# Patient Record
Sex: Male | Born: 1975 | Race: Black or African American | Hispanic: No | Marital: Single | Smoking: Current every day smoker
Health system: Southern US, Community
[De-identification: ages and names within clinical notes are randomized; demographics above are authoritative.]

## PROBLEM LIST (undated history)

## (undated) DIAGNOSIS — K219 Gastro-esophageal reflux disease without esophagitis: Secondary | ICD-10-CM

## (undated) HISTORY — PX: COLONOSCOPY: SHX174

---

## 2007-10-01 ENCOUNTER — Emergency Department (HOSPITAL_COMMUNITY): Admission: EM | Admit: 2007-10-01 | Discharge: 2007-10-01 | Payer: Self-pay | Admitting: Emergency Medicine

## 2007-10-24 ENCOUNTER — Emergency Department: Payer: Self-pay | Admitting: Emergency Medicine

## 2012-05-18 ENCOUNTER — Emergency Department (HOSPITAL_COMMUNITY): Payer: Self-pay

## 2012-05-18 ENCOUNTER — Emergency Department (HOSPITAL_COMMUNITY)
Admission: EM | Admit: 2012-05-18 | Discharge: 2012-05-18 | Disposition: A | Discharge status: Home / Self Care | Payer: Self-pay | Attending: Emergency Medicine | Admitting: Emergency Medicine

## 2012-05-18 ENCOUNTER — Encounter (HOSPITAL_COMMUNITY): Payer: Self-pay | Admitting: *Deleted

## 2012-05-18 DIAGNOSIS — R071 Chest pain on breathing: Secondary | ICD-10-CM | POA: Insufficient documentation

## 2012-05-18 DIAGNOSIS — M25519 Pain in unspecified shoulder: Secondary | ICD-10-CM | POA: Insufficient documentation

## 2012-05-18 DIAGNOSIS — R059 Cough, unspecified: Secondary | ICD-10-CM | POA: Insufficient documentation

## 2012-05-18 LAB — CBC
HCT: 42.9 % (ref 39.0–52.0)
Hemoglobin: 16.4 g/dL (ref 13.0–17.0)
MCH: 31.8 pg (ref 26.0–34.0)
MCHC: 38.2 g/dL — ABNORMAL HIGH (ref 30.0–36.0)
MCV: 83.3 fL (ref 78.0–100.0)
Platelets: 214 K/uL (ref 150–400)
RBC: 5.15 MIL/uL (ref 4.22–5.81)
RDW: 13.8 % (ref 11.5–15.5)
WBC: 9.7 K/uL (ref 4.0–10.5)

## 2012-05-18 LAB — BASIC METABOLIC PANEL WITH GFR
Chloride: 103 meq/L (ref 96–112)
GFR calc Af Amer: >90 mL/min (ref >90)
GFR calc non Af Amer: >90 mL/min (ref >90)
Potassium: 4.7 meq/L (ref 3.5–5.1)
Sodium: 137 meq/L (ref 135–145)

## 2012-05-18 LAB — BASIC METABOLIC PANEL
BUN: 12 mg/dL (ref 6–23)
CO2: 23 mEq/L (ref 19–32)
Calcium: 9.7 mg/dL (ref 8.4–10.5)
Creatinine, Ser: 0.99 mg/dL (ref 0.50–1.35)
Glucose, Bld: 110 mg/dL — ABNORMAL HIGH (ref 70–99)

## 2012-05-18 MED ORDER — CYCLOBENZAPRINE HCL 10 MG PO TABS: 10.0000 mg | ORAL_TABLET | Freq: Every day | ORAL | Status: DC

## 2012-05-18 MED ORDER — IBUPROFEN 800 MG PO TABS: 800.0000 mg | ORAL_TABLET | Freq: Three times a day (TID) | ORAL | Status: DC

## 2012-05-18 NOTE — ED Provider Notes (Signed)
History     CSN: [id number]  Arrival date & time 05/18/12  0534   First MD Initiated Contact with Patient 05/18/12 [phone]      Chief Complaint  Patient presents with  . Chest Pain    (Consider location/radiation/quality/duration/timing/severity/associated sxs/prior treatment) Patient is a 37 y.o. male presenting with chest pain. The history is provided by the patient. No language interpreter was used.  Chest Pain Pain location:  L chest Pain quality: sharp   Pain radiates to:  L shoulder Pain radiates to the back: yes   Pain severity:  Moderate Onset quality:  Sudden Duration: Pain is sharp and fleeting, lasting seconds.  Context comment:  Pain is sudden in onset with cough, usually occurs at night. Associated symptoms: no abdominal pain, no fever, no numbness, no palpitations, no shortness of breath, not vomiting and no weakness   Associated symptoms comment:  He has had some mild left shoulder soreness x 3 weeks. For the past 2 nights, he has experienced a sharp, shooting pain when he coughs that radiates into left neck and into the shoulder blade. It last seconds before it resolves. He denies shortness of breath, or pain with respiration. He says that his cough is a chronic cough that is unchanged. No fever.   History reviewed. No pertinent past medical history.  History reviewed. No pertinent past surgical history.  History reviewed. No pertinent family history.  History  Substance Use Topics  . Smoking status: Never Smoker   . Smokeless tobacco: Not on file  . Alcohol Use: No      Review of Systems  Constitutional: Negative for fever.  Respiratory: Negative.  Negative for shortness of breath.   Cardiovascular: Positive for chest pain. Negative for palpitations and leg swelling.  Gastrointestinal: Negative for vomiting and abdominal pain.  Musculoskeletal:       See HPI.  Skin: Negative.  Negative for pallor and rash.  Neurological: Negative.  Negative for  weakness and numbness.  Hematological: Does not bruise/bleed easily.  Psychiatric/Behavioral: Negative.  Negative for confusion.    Allergies  Review of patient's allergies indicates no known allergies.  Home Medications   Current Outpatient Rx  Name  Route  Sig  Dispense  Refill  . ibuprofen (ADVIL,MOTRIN) 200 MG tablet   Oral   Take 200 mg by mouth every 8 (eight) hours as needed for pain.           BP 132/88  Pulse 95  Temp(Src) 99.4 F (37.4 C) (Oral)  Resp 16  SpO2 94%  Physical Exam  Constitutional: He is oriented to person, place, and time. He appears well-developed and well-nourished.  HENT:  Head: Normocephalic.  Neck: Normal range of motion. Neck supple.  Cardiovascular: Normal rate and regular rhythm.   No murmur heard. Pulmonary/Chest: Effort normal and breath sounds normal. He has no wheezes. He has no rales. He exhibits tenderness.  Left sided chest tenderness to palpation. No swelling or discoloration.   Abdominal: Soft. Bowel sounds are normal. There is no tenderness. There is no rebound and no guarding.  Musculoskeletal: Normal range of motion.  Mild tenderness to left lateral neck to deltoid area. No swelling. No loss of strength. FROM. No upper back, scapular or parascapular tenderness.   Neurological: He is alert and oriented to person, place, and time.  Skin: Skin is warm and dry. No rash noted.  Psychiatric: He has a normal mood and affect.    ED Course  Procedures (including critical care time)  Labs Reviewed  BASIC METABOLIC PANEL - Abnormal; Notable for the following:    Glucose, Bld 110 (*)    All other components within normal limits  CBC  POCT I-STAT TROPONIN I   Results for orders placed during the hospital encounter of 05/18/12  BASIC METABOLIC PANEL      Result Value Range   Sodium 137  135 - 145 mEq/L   Potassium 4.7  3.5 - 5.1 mEq/L   Chloride 103  96 - 112 mEq/L   CO2 23  19 - 32 mEq/L   Glucose, Bld 110 (*) 70 - 99 mg/dL    BUN 12  6 - 23 mg/dL   Creatinine, Ser 1.61  0.50 - 1.35 mg/dL   Calcium 9.7  8.4 - 09.6 mg/dL   GFR calc non Af Amer >90  >90 mL/min   GFR calc Af Amer >90  >90 mL/min  POCT I-STAT TROPONIN I      Result Value Range   Troponin i, poc 0.00  0.00 - 0.08 ng/mL   Comment 3             Dg Chest 2 View  05/18/2012  *RADIOLOGY REPORT*  Clinical Data: Chest pain.  CHEST - 2 VIEW  Comparison: None  Findings: Linear densities in the right middle lobe and lingula. This likely represents atelectasis.  Recommend clinical correlation to completely exclude lingular infiltrate/early pneumonia.  No effusions.  Heart is normal size.  No acute bony abnormality.  IMPRESSION: Linear densities in the lingula and right middle lobe, greater within the lingula.  Findings likely reflect atelectasis. Recommend clinical correlation to completely exclude pneumonia.   Original Report Authenticated By: Charlett Nose, M.D.     Date: 05/18/2012  Rate: 93  Rhythm: normal sinus rhythm  QRS Axis: normal  Intervals: normal  ST/T Wave abnormalities: normal  Conduction Disutrbances:none  Narrative Interpretation:   Old EKG Reviewed: none available    No diagnosis found.   1. Chest wall pain  MDM  Symptoms are discomfort in left chest that is worse with movement, cough - suspect muscular pain, possible spasm. No SOB, neg EKG, neg CXR, neg labs with atypical symptoms - doubt ACS, pulmonary cause.        Arnoldo Hooker, PA-C 05/18/12 0730

## 2012-05-18 NOTE — ED Provider Notes (Signed)
Medical screening examination/treatment/procedure(s) were performed by non-physician practitioner and as supervising physician I was immediately available for consultation/collaboration.   Colin Mason. Oletta Lamas, MD 05/18/12 [phone]

## 2012-05-18 NOTE — ED Notes (Signed)
Pt c/o CP since last night, c/o left CP with intermittent radiation to the left shoulder.  Denies SOB, n/v, diaphoresis

## 2012-05-28 ENCOUNTER — Emergency Department (HOSPITAL_COMMUNITY)
Admission: EM | Admit: 2012-05-28 | Discharge: 2012-05-28 | Disposition: A | Discharge status: Home / Self Care | Payer: Self-pay | Attending: Emergency Medicine | Admitting: Emergency Medicine

## 2012-05-28 ENCOUNTER — Emergency Department (HOSPITAL_COMMUNITY): Payer: Self-pay

## 2012-05-28 ENCOUNTER — Encounter (HOSPITAL_COMMUNITY): Payer: Self-pay | Admitting: *Deleted

## 2012-05-28 DIAGNOSIS — R51 Headache: Secondary | ICD-10-CM | POA: Insufficient documentation

## 2012-05-28 DIAGNOSIS — M542 Cervicalgia: Secondary | ICD-10-CM | POA: Insufficient documentation

## 2012-05-28 DIAGNOSIS — F17200 Nicotine dependence, unspecified, uncomplicated: Secondary | ICD-10-CM | POA: Insufficient documentation

## 2012-05-28 MED ORDER — IBUPROFEN 800 MG PO TABS
800.0000 mg | ORAL_TABLET | Freq: Once | ORAL | Status: AC
  Administered 2012-05-28: 800 mg via ORAL
  Filled 2012-05-28: qty 1

## 2012-05-28 MED ORDER — PREDNISONE 20 MG PO TABS: 60.0000 mg | ORAL_TABLET | Freq: Every day | ORAL | Status: DC

## 2012-05-28 MED ORDER — OXYCODONE-ACETAMINOPHEN 5-325 MG PO TABS: 1.0000 | ORAL_TABLET | Freq: Four times a day (QID) | ORAL | Status: DC | PRN

## 2012-05-28 NOTE — ED Provider Notes (Signed)
History     CSN: [id number]  Arrival date & time 05/28/12  1146   First MD Initiated Contact with Patient 05/28/12 1154      Chief Complaint  Patient presents with  . Headache    (Consider location/radiation/quality/duration/timing/severity/associated sxs/prior treatment) Patient is a 37 y.o. male presenting with headaches. The history is provided by the patient.  Headache Associated symptoms: neck pain   Associated symptoms: no abdominal pain, no back pain, no diarrhea, no pain, no nausea, no neck stiffness, no numbness and no vomiting    patient was seen about a month ago for left sided chest pain and shoulder pain. His diagnosis was chest wall pain. Since then he has had head pain it goes up his neck to his head. It is worse with certain movements. Is worse with exertion. Is better with sitting although he still has a dull pain there. No difficulty hearing. No difficulty seen. No nausea or vomiting. No confusion. No numbness or weakness. He has some relief with ibuprofen. History reviewed. No pertinent past medical history.  History reviewed. No pertinent past surgical history.  History reviewed. No pertinent family history.  History  Substance Use Topics  . Smoking status: Current Every Day Smoker -- 0.50 packs/day  . Smokeless tobacco: Not on file  . Alcohol Use: No      Review of Systems  Constitutional: Negative for activity change and appetite change.  HENT: Positive for neck pain. Negative for neck stiffness.   Eyes: Negative for pain.  Respiratory: Negative for chest tightness and shortness of breath.   Cardiovascular: Negative for chest pain and leg swelling.  Gastrointestinal: Negative for nausea, vomiting, abdominal pain and diarrhea.  Genitourinary: Negative for flank pain.  Musculoskeletal: Negative for back pain.  Skin: Negative for rash.  Neurological: Positive for headaches. Negative for weakness and numbness.  Psychiatric/Behavioral: Negative for  behavioral problems.    Allergies  Review of patient's allergies indicates no known allergies.  Home Medications   Current Outpatient Rx  Name  Route  Sig  Dispense  Refill  . cyclobenzaprine (FLEXERIL) 10 MG tablet   Oral   Take 10 mg by mouth 3 (three) times daily as needed for muscle spasms.         Marland Kitchen ibuprofen (ADVIL,MOTRIN) 800 MG tablet   Oral   Take 800 mg by mouth every 8 (eight) hours as needed for pain.         Marland Kitchen oxyCODONE-acetaminophen (PERCOCET/ROXICET) 5-325 MG per tablet   Oral   Take 1-2 tablets by mouth every 6 (six) hours as needed for pain.   10 tablet   0   . predniSONE (DELTASONE) 20 MG tablet   Oral   Take 3 tablets (60 mg total) by mouth daily.   9 tablet   0     BP 138/88  Pulse 92  Temp(Src) 98.4 F (36.9 C)  Resp 16  SpO2 97%  Physical Exam  Nursing note and vitals reviewed. Constitutional: He is oriented to person, place, and time. He appears well-developed and well-nourished.  HENT:  Head: Normocephalic and atraumatic.  Mild tenderness to left parietal area. Mild tenderness over mastoid.  Eyes: EOM are normal. Pupils are equal, round, and reactive to light.  Neck: Normal range of motion. Neck supple.  Cardiovascular: Normal rate, regular rhythm and normal heart sounds.   No murmur heard. Pulmonary/Chest: Effort normal and breath sounds normal.  Abdominal: Soft. Bowel sounds are normal. He exhibits no distension and no mass.  There is no tenderness. There is no rebound and no guarding.  Musculoskeletal: Normal range of motion. He exhibits no edema.  Neurological: He is alert and oriented to person, place, and time. No cranial nerve deficit.  Skin: Skin is warm and dry.  Psychiatric: He has a normal mood and affect.   bilateral TMs normal  ED Course  Procedures (including critical care time)  Labs Reviewed - No data to display Ct Head Wo Contrast  05/28/2012  *RADIOLOGY REPORT*  Clinical Data: Headache  CT HEAD WITHOUT CONTRAST   Technique:  Contiguous axial images were obtained from the base of the skull through the vertex without contrast.  Comparison: None.  Findings: No acute intracranial hemorrhage.  No focal mass lesion. No CT evidence of acute infarction.   No midline shift or mass effect.  No hydrocephalus.  Basilar cisterns are patent. Paranasal sinuses and mastoid air cells are clear.  Orbits are normal.  IMPRESSION:  No acute intracranial findings.   Original Report Authenticated By: Genevive Bi, M.D.      1. Headache       MDM  Patient presents with a headache for the last few weeks. It comes and goes. It is from his neck up to his head. Negative head CT. He has a nonfocal examination. He has had recent muscular issues with his left chest and neck. The headache may be related to this. He'll be treated with a short course of prednisone and some pain medicines. He will followup as needed.        Juliet Rude. Rubin Payor, MD 05/28/12 1517

## 2012-05-28 NOTE — ED Notes (Signed)
Pt reports being here last week for chest wall pain.  Reports increased pain since then in his head.  Denies chest pain.  Reports that the pain shoots up the back of his head-worsening with movement.  Pt denies blurred vision.  Denies nausea/vomiting.

## 2012-12-24 ENCOUNTER — Emergency Department (HOSPITAL_COMMUNITY)
Admission: EM | Admit: 2012-12-24 | Discharge: 2012-12-24 | Disposition: A | Discharge status: Home / Self Care | Payer: Self-pay | Attending: Emergency Medicine | Admitting: Emergency Medicine

## 2012-12-24 ENCOUNTER — Encounter (HOSPITAL_COMMUNITY): Payer: Self-pay | Admitting: Emergency Medicine

## 2012-12-24 ENCOUNTER — Emergency Department (HOSPITAL_COMMUNITY): Payer: Self-pay

## 2012-12-24 DIAGNOSIS — IMO0001 Reserved for inherently not codable concepts without codable children: Secondary | ICD-10-CM | POA: Insufficient documentation

## 2012-12-24 DIAGNOSIS — M542 Cervicalgia: Secondary | ICD-10-CM | POA: Insufficient documentation

## 2012-12-24 DIAGNOSIS — F17200 Nicotine dependence, unspecified, uncomplicated: Secondary | ICD-10-CM | POA: Insufficient documentation

## 2012-12-24 DIAGNOSIS — M25512 Pain in left shoulder: Secondary | ICD-10-CM

## 2012-12-24 DIAGNOSIS — M25519 Pain in unspecified shoulder: Secondary | ICD-10-CM | POA: Insufficient documentation

## 2012-12-24 DIAGNOSIS — M7918 Myalgia, other site: Secondary | ICD-10-CM

## 2012-12-24 MED ORDER — KETOROLAC TROMETHAMINE 60 MG/2ML IM SOLN
INTRAMUSCULAR | Status: AC
  Filled 2012-12-24: qty 2

## 2012-12-24 MED ORDER — KETOROLAC TROMETHAMINE 60 MG/2ML IM SOLN
60.0000 mg | Freq: Once | INTRAMUSCULAR | Status: AC
  Administered 2012-12-24: 60 mg via INTRAMUSCULAR

## 2012-12-24 NOTE — ED Provider Notes (Signed)
CSN: [id number]     Arrival date & time 12/24/12  1138 History  This chart was scribed for non-physician practitioner Mora Bellman, PA-C working with Derwood Kaplan, MD by Valera Castle, ED scribe. This patient was seen in room WTR9/WTR9 and the patient's care was started at 12:51 PM.    Chief Complaint  Patient presents with  . Shoulder Pain  . Neck Pain   The history is provided by the patient. No language interpreter was used.   HPI Comments: Colin Mason is a 37 y.o. male who presents to the Emergency Department complaining of sharp, constant left shoulder and neck pain, with a severity of 8/10, onset 8-9 months ago. He reports being seen at [facility] when symptoms started and was diagnosed with muscle problems. He reports the pain has increased in the last 2 months and is having difficulty sleeping. He states that the pain is radiating down his left side, from the top of his head to his left shoulder, and he has trouble lifting his left arm. He states that movement exacerbates the pain, but he denies pain when just touching the area. He denies any pain to his right shoulder. He denies playing any football recently. He denies fever, SOB, chest pain, dizziness, lightheadedness, and any other associated symptoms. He reports smoking .25 PPD, every day, along with marijuana use, but denies EtOH use. He has no known allergies. He denies any prior medical history.   History reviewed. No pertinent past medical history. History reviewed. No pertinent past surgical history. History reviewed. No pertinent family history. History  Substance Use Topics  . Smoking status: Current Every Day Smoker -- 0.25 packs/day  . Smokeless tobacco: Not on file  . Alcohol Use: No    Review of Systems  Constitutional: Negative for fever.  Respiratory: Negative for shortness of breath.   Cardiovascular: Negative for chest pain.  Musculoskeletal: Positive for neck pain.       Pain from top of pt's head down to  his left shoulder.   All other systems reviewed and are negative.    Allergies  Review of patient's allergies indicates no known allergies.  Home Medications   Current Outpatient Rx  Name  Route  Sig  Dispense  Refill  . ibuprofen (ADVIL,MOTRIN) 200 MG tablet   Oral   Take 200 mg by mouth every 6 (six) hours as needed for pain or headache.          Triage Vitals: BP 131/82  Pulse 107  Temp(Src) 98.9 F (37.2 C) (Oral)  Resp 20  SpO2 98%  Physical Exam  Nursing note and vitals reviewed. Constitutional: He is oriented to person, place, and time. He appears well-developed and well-nourished. He does not appear ill. No distress.  HENT:  Head: Normocephalic and atraumatic.  Right Ear: External ear normal.  Left Ear: External ear normal.  Nose: Nose normal.  Mouth/Throat: Uvula is midline, oropharynx is clear and moist and mucous membranes are normal.  Eyes: Conjunctivae, EOM and lids are normal. Pupils are equal, round, and reactive to light.  Neck: Trachea normal, normal range of motion, full passive range of motion without pain and phonation normal. Neck supple. No spinous process tenderness and no muscular tenderness present. Carotid bruit is not present. No tracheal deviation present.  Cardiovascular: Normal rate, regular rhythm, normal heart sounds, intact distal pulses and normal pulses.  Exam reveals no decreased pulses.   No carotid bruit or thrill  Pulmonary/Chest: Effort normal and breath sounds normal.  No stridor. He has no decreased breath sounds.  Abdominal: Soft. He exhibits no distension. There is no tenderness.  Musculoskeletal: Normal range of motion.  Tender to palpation over left side of head. Tender to palpation to SCM. ROM of left shoulder limited in abduction. TTP over left shoulder.  Pain with empty can test on left.   Neurological: He is alert and oriented to person, place, and time. He has normal strength and normal reflexes. No sensory deficit.  Coordination normal.  Grip strength 5/5 in hands bilaterally. Strength 5/5 in BLE.   Skin: Skin is warm and dry. He is not diaphoretic.  Psychiatric: He has a normal mood and affect. His behavior is normal.    ED Course  Procedures (including critical care time)  DIAGNOSTIC STUDIES: Oxygen Saturation is 98% on room air, normal by my interpretation.    COORDINATION OF CARE: 12:57 PM - Discussed treatment plan which includes left shoulder x-ray with pt at bedside and pt agreed to plan. Discussed with pt that the symptoms most likely are not due to a pinched nerve.     Labs Review Labs Reviewed - No data to display Imaging Review Dg Cervical Spine Complete  12/24/2012   CLINICAL DATA:  Left side neck pain, no known injury  EXAM: CERVICAL SPINE  4+ VIEWS  COMPARISON:  None.  FINDINGS: Six views of cervical spine submitted. No acute fracture or subluxation. Mild anterior spurring lower endplate of C5 vertebral body. Minimal disc space flattening at C4-C5 and C5-C6 level. No prevertebral soft tissue swelling. C1-C2 relationship is unremarkable. Cervical airway is patent.  IMPRESSION: No acute fracture or subluxation. Minimal degenerative changes.   Electronically Signed   By: Natasha Mead M.D.   On: 12/24/2012 15:06   Dg Shoulder Left  12/24/2012   CLINICAL DATA:  Shoulder pain  EXAM: LEFT SHOULDER - 2+ VIEW  COMPARISON:  No evidence for fracture. No findings to suggest shoulder separation or dislocation. No worrisome lytic or sclerotic osseous lesion.  FINDINGS: There is no evidence of fracture or dislocation. There is no evidence of arthropathy or other focal bone abnormality. Soft tissues are unremarkable.  IMPRESSION: Negative.   Electronically Signed   By: Kennith Center M.D.   On: 12/24/2012 13:28    MDM   1. Left shoulder pain   2. Musculoskeletal pain    Patient presents today with worsening left shoulder pain over the past 9 months. No neuro deficit. This pain is likely MSK in nature.  XR cervical spine and left shoulder are negative. Referred to PCP, ortho, and given stretching exercises. Dr. Rhunette Croft evaluated this patient and agrees with plan. Return instructions given. Vital signs stable for discharge. Patient / Family / Caregiver informed of clinical course, understand medical decision-making process, and agree with plan.    I personally performed the services described in this documentation, which was scribed in my presence. The recorded information has been reviewed and is accurate.    Mora Bellman, PA-C 12/25/12 1230

## 2012-12-24 NOTE — Progress Notes (Signed)
[facility] provided pt with a list of primary care resources. Patient stated that he was pending insurance through job.  °

## 2012-12-24 NOTE — ED Notes (Addendum)
Pt c/o L shoulder and neck pain since "January-February-ish."  Pain score 8/10.  Sts was seen at [facility] when symptoms started and was told that "it was just a muscle."  Sts pain has gotten increasingly worse x 2 months and he is having difficulty sleeping.       Pt sts he has "cut way back on working out and playing the drums."

## 2012-12-26 NOTE — ED Provider Notes (Signed)
Medical screening examination/treatment/procedure(s) were conducted as a shared visit with non-physician practitioner(s) and myself.  I personally evaluated the patient during the encounter.  Pt comes in with neck pain, shoulder pain x several months. No cardiac risk factors, no trauma, no neuro deficits. Pt's exam consistent with rotator cuff tendonitis. We considered carotid dissection as well, but with the duration of symptoms, no neuro deficits, no true risk factors and + PE finding for tendonitis makes it very unlikely. Proper f/u provided.   Derwood Kaplan, MD 12/26/12 [phone]

## 2015-07-10 IMAGING — CR DG CERVICAL SPINE COMPLETE 4+V
6 series · 6 of 6 positions shown · non-contrast
Comparison: None.

CLINICAL DATA: Left side neck pain, no known injury

EXAM:
CERVICAL SPINE  4+ VIEWS

[w cervical spine lat]
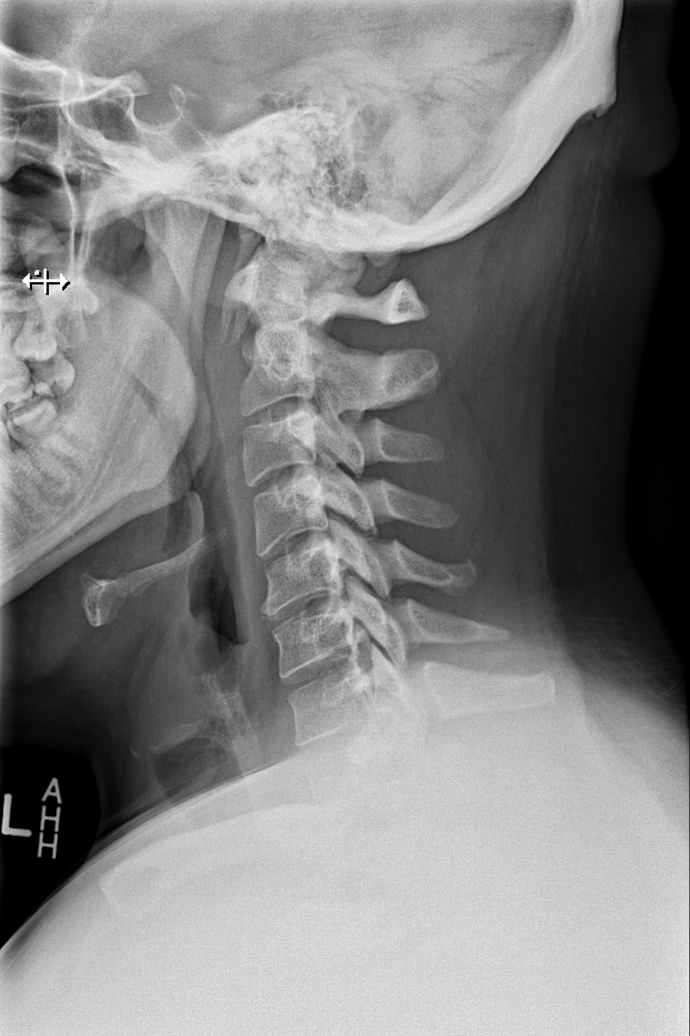

[w cervical swimmers]
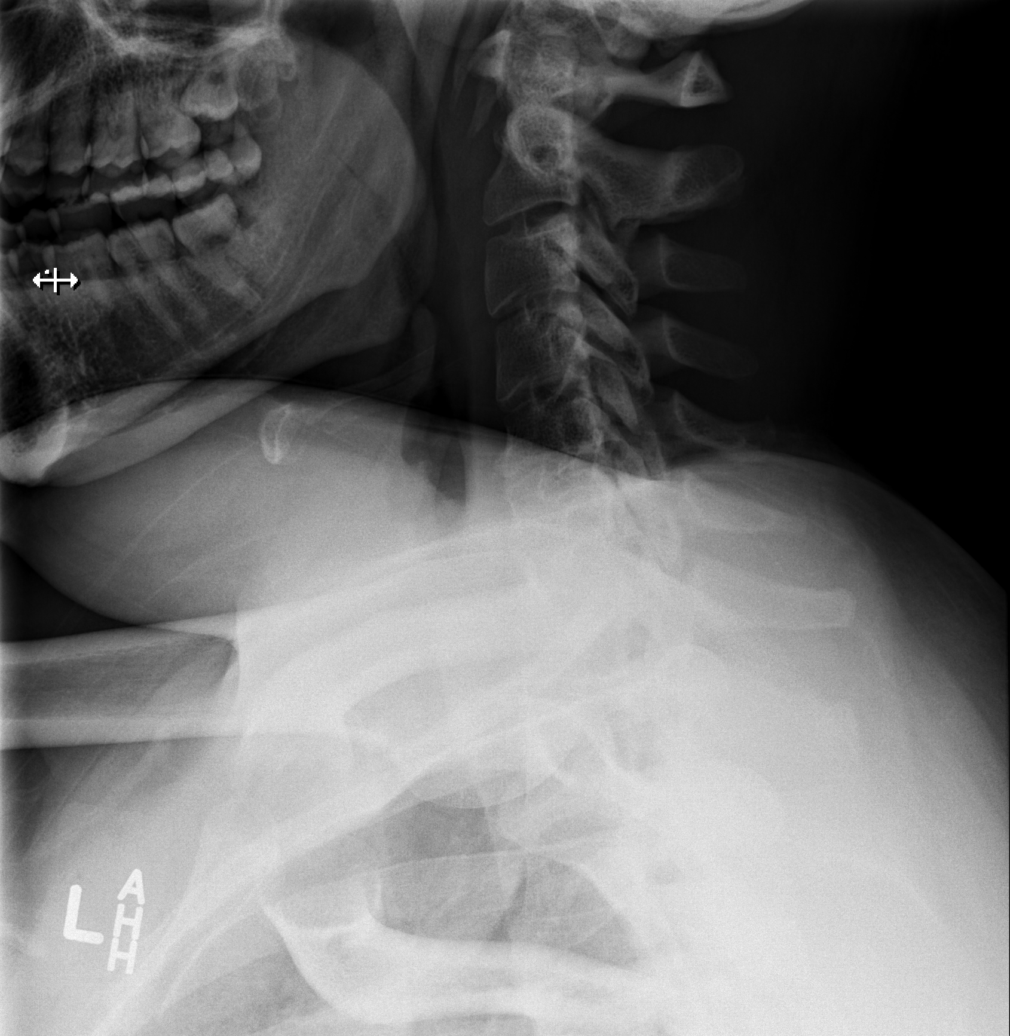

[w cervical spine ap_obl (1 of 2)]
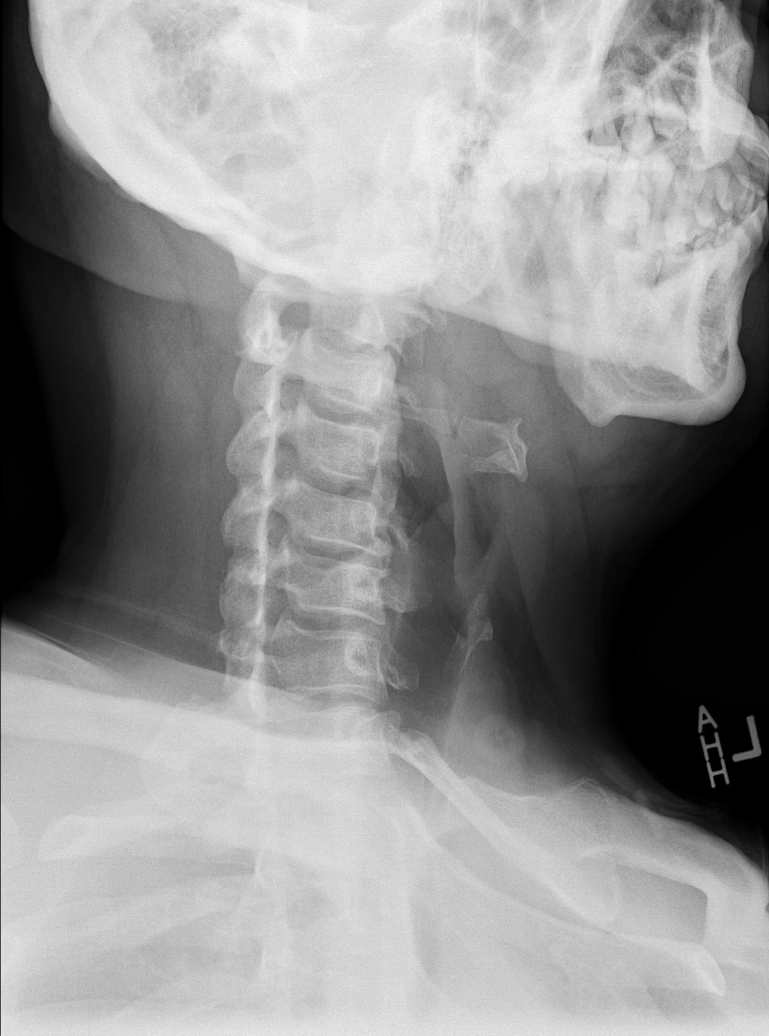

[w cervical spine ap_obl (2 of 2)]
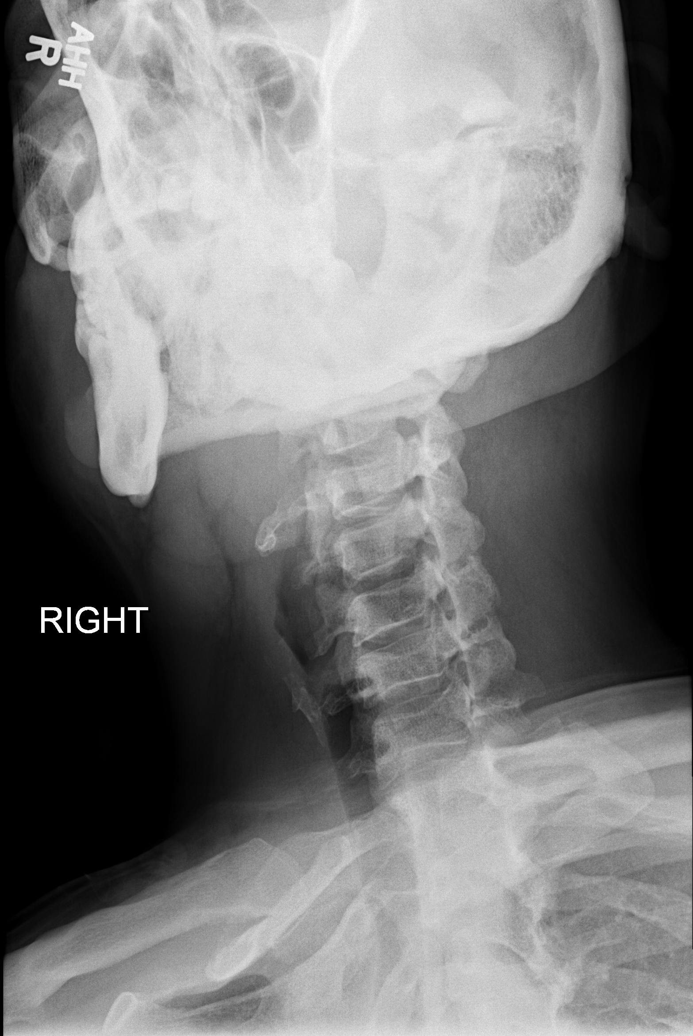

[w cervical spine ap]
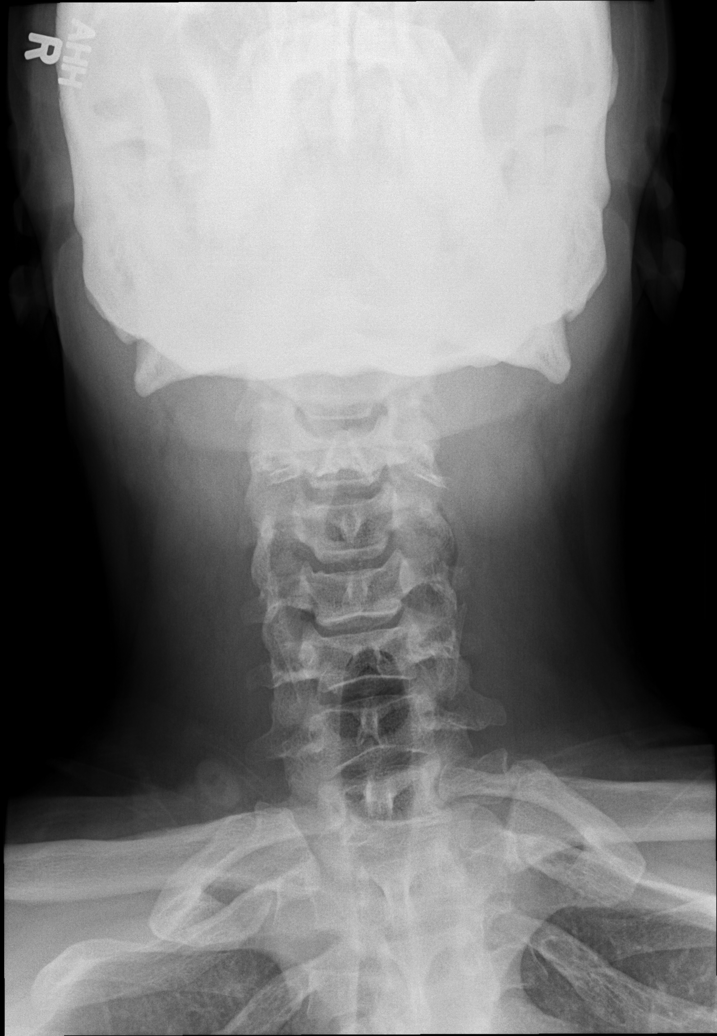

[w cervical spine odontoid]
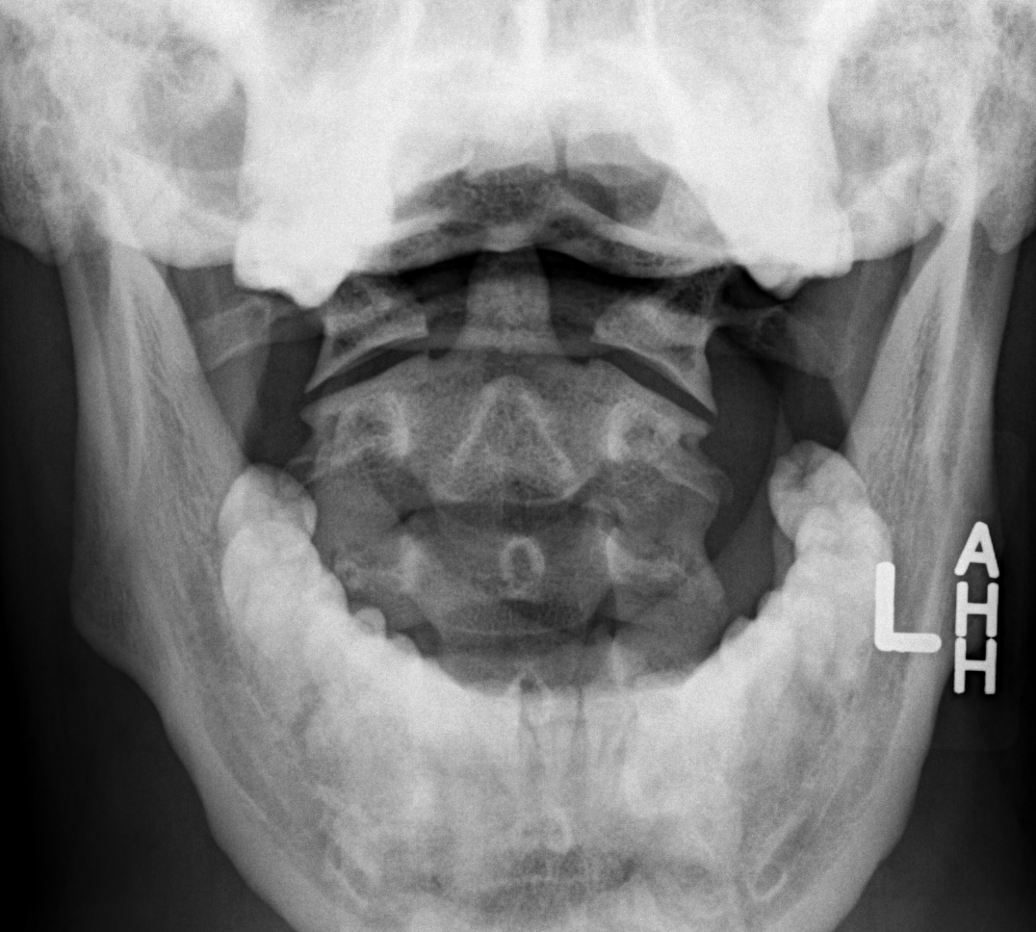

[6 of 6 positions shown; findings below may reference images not displayed]

FINDINGS: Six views of cervical spine submitted. No acute fracture or
subluxation. Mild anterior spurring lower endplate of C5 vertebral
body. Minimal disc space flattening at C4-C5 and C5-C6 level. No
prevertebral soft tissue swelling. C1-C2 relationship is
unremarkable. Cervical airway is patent.
IMPRESSION: No acute fracture or subluxation. Minimal degenerative changes.

## 2020-08-23 ENCOUNTER — Emergency Department (HOSPITAL_COMMUNITY)
Admission: EM | Admit: 2020-08-23 | Discharge: 2020-08-23 | Disposition: A | Discharge status: Home / Self Care | Payer: Self-pay | Attending: Emergency Medicine | Admitting: Emergency Medicine

## 2020-08-23 ENCOUNTER — Encounter (HOSPITAL_COMMUNITY): Payer: Self-pay | Admitting: Emergency Medicine

## 2020-08-23 ENCOUNTER — Other Ambulatory Visit: Payer: Self-pay

## 2020-08-23 DIAGNOSIS — T63441A Toxic effect of venom of bees, accidental (unintentional), initial encounter: Secondary | ICD-10-CM | POA: Insufficient documentation

## 2020-08-23 DIAGNOSIS — F17210 Nicotine dependence, cigarettes, uncomplicated: Secondary | ICD-10-CM | POA: Insufficient documentation

## 2020-08-23 DIAGNOSIS — Z91030 Bee allergy status: Secondary | ICD-10-CM | POA: Insufficient documentation

## 2020-08-23 DIAGNOSIS — R220 Localized swelling, mass and lump, head: Secondary | ICD-10-CM

## 2020-08-23 DIAGNOSIS — T7840XA Allergy, unspecified, initial encounter: Secondary | ICD-10-CM

## 2020-08-23 LAB — COMPREHENSIVE METABOLIC PANEL
ALT: 22 U/L (ref 0–44)
AST: 23 U/L (ref 15–41)
Albumin: 3.6 g/dL (ref 3.5–5.0)
Alkaline Phosphatase: 61 U/L (ref 38–126)
Anion gap: 9 (ref 5–15)
BUN: 11 mg/dL (ref 6–20)
CO2: 21 mmol/L — ABNORMAL LOW (ref 22–32)
Calcium: 9 mg/dL (ref 8.9–10.3)
Chloride: 105 mmol/L (ref 98–111)
Creatinine, Ser: 1.1 mg/dL (ref 0.61–1.24)
GFR, Estimated: >60 mL/min (ref >60)
Glucose, Bld: 112 mg/dL — ABNORMAL HIGH (ref 70–99)
Potassium: 5.1 mmol/L (ref 3.5–5.1)
Sodium: 135 mmol/L (ref 135–145)
Total Bilirubin: 0.4 mg/dL (ref 0.3–1.2)
Total Protein: 6.4 g/dL — ABNORMAL LOW (ref 6.5–8.1)

## 2020-08-23 LAB — CBC WITH DIFFERENTIAL/PLATELET
Abs Immature Granulocytes: 0.03 10*3/uL (ref 0.00–0.07)
Basophils Absolute: 0 10*3/uL (ref 0.0–0.1)
Basophils Relative: 0 %
Eosinophils Absolute: 0 10*3/uL (ref 0.0–0.5)
Eosinophils Relative: 1 %
HCT: 42 % (ref 39.0–52.0)
Hemoglobin: 15.4 g/dL (ref 13.0–17.0)
Immature Granulocytes: 0 %
Lymphocytes Relative: 12 %
Lymphs Abs: 1.1 10*3/uL (ref 0.7–4.0)
MCH: 30.9 pg (ref 26.0–34.0)
MCHC: 36.7 g/dL — ABNORMAL HIGH (ref 30.0–36.0)
MCV: 84.3 fL (ref 80.0–100.0)
Monocytes Absolute: 0.2 10*3/uL (ref 0.1–1.0)
Monocytes Relative: 2 %
Neutro Abs: 7.3 10*3/uL (ref 1.7–7.7)
Neutrophils Relative %: 85 %
Platelets: 255 10*3/uL (ref 150–400)
RBC: 4.98 MIL/uL (ref 4.22–5.81)
RDW: 13.2 % (ref 11.5–15.5)
WBC: 8.6 10*3/uL (ref 4.0–10.5)
nRBC: 0 % (ref 0.0–0.2)

## 2020-08-23 MED ORDER — PREDNISONE 10 MG PO TABS: ORAL_TABLET | ORAL | 0 refills | Status: AC

## 2020-08-23 MED ORDER — FAMOTIDINE IN NACL 20-0.9 MG/50ML-% IV SOLN
20.0000 mg | Freq: Once | INTRAVENOUS | Status: AC
  Administered 2020-08-23: 20 mg via INTRAVENOUS
  Filled 2020-08-23: qty 50

## 2020-08-23 MED ORDER — DIPHENHYDRAMINE HCL 50 MG/ML IJ SOLN
25.0000 mg | Freq: Once | INTRAMUSCULAR | Status: AC
  Administered 2020-08-23: 25 mg via INTRAVENOUS
  Filled 2020-08-23: qty 1

## 2020-08-23 MED ORDER — LORATADINE 10 MG PO TABS
10.0000 mg | ORAL_TABLET | Freq: Once | ORAL | Status: AC
  Administered 2020-08-23: 10 mg via ORAL
  Filled 2020-08-23: qty 1

## 2020-08-23 MED ORDER — METHYLPREDNISOLONE SODIUM SUCC 125 MG IJ SOLR
125.0000 mg | Freq: Once | INTRAMUSCULAR | Status: AC
  Administered 2020-08-23: 125 mg via INTRAVENOUS
  Filled 2020-08-23: qty 2

## 2020-08-23 MED ORDER — EPINEPHRINE 0.3 MG/0.3ML IJ SOAJ: 0.3000 mg | INTRAMUSCULAR | 0 refills | Status: AC | PRN

## 2020-08-23 NOTE — ED Provider Notes (Signed)
  Physical Exam  BP (!) 161/95   Pulse 90   Resp 18   Ht 5\' 11"  (1.803 Mason)   Wt 130 kg   SpO2 96%   BMI 39.97 kg/Mason   Physical Exam        ED Course/Procedures     Procedures  MDM  Patient care assumed from Colin M PA-C at shift change, please see her note for full HPI.  Briefly, patient here status post bee sting, with bilateral periorbital edema, unable to open his eyes, has taken Benadryl without improvement.  Patient was given Pepcid, 13, Benadryl, Solu-Medrol.  He was given prescriptions.  Plan is for a recheck, and disposition home if adequate.  8:57 AM patient reevaluated by me, persistently periorbital edema present, he does report slight improvement after medications.  Vitals noted for hypertension, does report no prior meds, currently on no ACE inhibitor's.  9:12 AM symptoms have improved midly, will obtain blood work to check for any systemic damage.  Patient remains in stable condition, oropharynx is clear without any swelling, uvula is midline.  Blood work was reviewed, CBC without any leukocytosis, hemoglobin stable.  CMP without any electrolyte derangement, creatinine levels within normal limits.  LFTs unremarkable, he is not having any systemic signs such as abdominal pain, shortness of breath, chest pain.  Does report improvement in symptoms, on reevaluation he is able to open his eyes better than before.  We did discuss medication that was prescribed to help improve his condition.  He was also provided with an EpiPen by my colleague, we discussed usage of this if needed.  Patient understands and agrees with management, tolerating p.o. adequately after having a meal here.  Vitals stable, patient stable for discharge.  Portions of this note were generated with . Dictation errors may occur despite best attempts at proofreading.         Scientist, clinical (histocompatibility and immunogenetics), PA-C 08/23/20 1101    10/23/20, MD 08/23/20 2322

## 2020-08-23 NOTE — Discharge Instructions (Signed)
Thank you for allowing me to care for you today in the Emergency Department.   Apply an ice pack for 15 to 20 minutes up to 3-4 times a day to help with swelling.  Starting tomorrow, take prednisone as prescribed.  You can also use loratadine, Pepcid and Benadryl, which are over-the-counter.  Use as directed on the label.  I have also given you a prescription for an EpiPen.  I have attached instructions on how to use this medication.

## 2020-08-23 NOTE — ED Triage Notes (Signed)
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 Respirations unlabored/airway intact.

## 2020-08-23 NOTE — ED Provider Notes (Signed)
[facility] EMERGENCY DEPARTMENT Provider Note   CSN: [id number] Arrival date & time: 08/23/20  0553     History Chief Complaint  Patient presents with  . Allergic Reaction / Bee Sting ( Facial Swelling)     Colin Mason is a 45 y.o. male with no chronic medical conditions who presents the emergency department with a chief complaint of facial swelling.  The patient works in Aeronautical engineer.  Yesterday morning, he saw a black insect buzzing around him and then felt a prick on his forehead.  He suspects that he was stung by a bee.  Shortly after the bee sting, the patient developed itching to his entire body and swelling to his face that has gradually worsened since onset.  He reports that he initially had some swelling of his lips, lightheadedness, and shortness of breath, but this has since resolved.  However, the swelling to his face continued to worsen until he could no longer open his eyes.  He has taken 6 tablets of Benadryl over the last 24 hours with no improvement of the swelling around his eyes.  He denies throat closing, difficulty swallowing, wheezing, vomiting, abdominal pain, syncope, chest pain, rash.  He reports that approximately 4 months ago that he was stung by a bee in the mouth twice and did have swelling at that time.  However, due to his occupation he has been stung by insects many times and has never had a severe reaction.  No new soaps or lotions, hygiene products, new foods, fever, chills, visual changes, eye drainage, sinus pain or pressure.   The history is provided by the patient and medical records. No language interpreter was used.       History reviewed. No pertinent past medical history.  There are no problems to display for this patient.   History reviewed. No pertinent surgical history.     No family history on file.  Social History   Tobacco Use  . Smoking status: Current Every Day Smoker    Packs/day: 0.25  . Smokeless  tobacco: Never Used  Substance Use Topics  . Alcohol use: No  . Drug use: Yes    Types: Marijuana    Home Medications Prior to Admission medications   Medication Sig Start Date End Date Taking? Authorizing Provider  EPINEPHrine 0.3 mg/0.3 mL IJ SOAJ injection Inject 0.3 mg into the muscle as needed for anaphylaxis. 08/23/20  Yes Katherine Syme A, PA-C  predniSONE (DELTASONE) 10 MG tablet Take 6 tablets (60 mg total) by mouth daily with breakfast for 1 day, THEN 5 tablets (50 mg total) daily with breakfast for 1 day, THEN 4 tablets (40 mg total) daily with breakfast for 1 day, THEN 3 tablets (30 mg total) daily with breakfast for 1 day, THEN 2 tablets (20 mg total) daily with breakfast for 1 day, THEN 1 tablet (10 mg total) daily with breakfast for 1 day. 08/23/20 08/29/20 Yes Donyale Berthold A, PA-C  ibuprofen (ADVIL,MOTRIN) 200 MG tablet Take 200 mg by mouth every 6 (six) hours as needed for pain or headache.    [provider]    Allergies    Patient has no known allergies.  Review of Systems   Review of Systems  Constitutional: Negative for appetite change, chills, fatigue and fever.  HENT: Positive for facial swelling. Negative for congestion, sore throat and trouble swallowing.   Eyes: Negative for visual disturbance.  Respiratory: Negative for shortness of breath and wheezing.   Cardiovascular: Negative for  chest pain and palpitations.  Gastrointestinal: Negative for abdominal pain, diarrhea, nausea and vomiting.  Genitourinary: Negative for dysuria.  Musculoskeletal: Negative for back pain.  Skin: Negative for rash and wound.  Allergic/Immunologic: Negative for immunocompromised state.  Neurological: Negative for headaches.  Psychiatric/Behavioral: Negative for confusion.    Physical Exam Updated Vital Signs BP (!) 149/104   Pulse 83   Resp 14   Ht 5\' 11"  (1.803 m)   Wt 130 kg   SpO2 98%   BMI 39.97 kg/m   Physical Exam Vitals and nursing note reviewed.   Constitutional:      Appearance: He is well-developed.  HENT:     Head: Normocephalic.     Comments: Bilateral periorbital edema.  Minimal erythema, but no warmth.  No drainage from bilateral eyes.  No maxillary or frontal sinus tenderness.    Mouth/Throat:     Mouth: Mucous membranes are moist.     Pharynx: No oropharyngeal exudate or posterior oropharyngeal erythema.     Comments: Phonation is normal.  No sublingual edema.  Posterior oropharynx is patent.  Uvula is midline.  Tolerating secretions without difficulty.  Neck is supple. Eyes:     Extraocular Movements: Extraocular movements intact.     Conjunctiva/sclera: Conjunctivae normal.     Pupils: Pupils are equal, round, and reactive to light.     Comments: Pupils are equal round and reactive to light.  Extraocular movements are intact.  Cardiovascular:     Rate and Rhythm: Normal rate and regular rhythm.     Pulses: Normal pulses.     Heart sounds: Normal heart sounds. No murmur heard. No friction rub. No gallop.   Pulmonary:     Effort: Pulmonary effort is normal. No respiratory distress.     Breath sounds: No stridor. No wheezing, rhonchi or rales.     Comments: Lungs are clear to auscultation bilaterally.  Chest:     Chest wall: No tenderness.  Abdominal:     General: There is no distension.     Palpations: Abdomen is soft.     Tenderness: There is no abdominal tenderness.  Musculoskeletal:     Cervical back: Neck supple.  Skin:    General: Skin is warm and dry.     Comments: No rashes  Neurological:     Mental Status: He is alert.  Psychiatric:        Behavior: Behavior normal.       ED Results / Procedures / Treatments   Labs (all labs ordered are listed, but only abnormal results are displayed) Labs Reviewed - No data to display  EKG None  Radiology No results found.  Procedures Procedures   Medications Ordered in ED Medications  diphenhydrAMINE (BENADRYL) injection 25 mg (25 mg Intravenous  Given 08/23/20 0615)  famotidine (PEPCID) IVPB 20 mg premix (0 mg Intravenous Stopped 08/23/20 0701)  methylPREDNISolone sodium succinate (SOLU-MEDROL) 125 mg/2 mL injection 125 mg (125 mg Intravenous Given 08/23/20 0615)  loratadine (CLARITIN) tablet 10 mg (10 mg Oral Given 08/23/20 0620)    ED Course  I have reviewed the triage vital signs and the nursing notes.  Pertinent labs & imaging results that were available during my care of the patient were reviewed by me and considered in my medical decision making (see chart for details).    MDM Rules/Calculators/A&P                          45 year old male who  presents the emergency department with a 1 day history of facial swelling.  His bilateral eyes are swollen shut.  Symptoms began very soon after he suspects that he was stung by a bee yesterday morning.  He initially had some shortness of breath and itching as well as lightheadedness, but these symptoms have since resolved.  Vital signs are stable.  On exam, bilateral eyes are swollen shut.  No indication for epinephrine at this time.  We will give Pepcid, Solu-Medrol, Benadryl, and loratadine and reassess.   I have a low suspicion for cellulitis, myxedema coma, volume overload.  Patient will likely be appropriate for discharge to home with an EpiPen and continued symptomatic management after he was medicated and reevaluated.  Patient care transferred to PA Soto at the end of my shift pending re-evaluation. Patient presentation, ED course, and plan of care discussed with review of all pertinent labs and imaging. Please see his/her note for further details regarding further ED course and disposition.   Final Clinical Impression(s) / ED Diagnoses Final diagnoses:  Allergic reaction, initial encounter  Facial swelling    Rx / DC Orders ED Discharge Orders         Ordered    EPINEPHrine 0.3 mg/0.3 mL IJ SOAJ injection  As needed        08/23/20 0616    predniSONE (DELTASONE) 10 MG tablet         08/23/20 0616           Frederik Pear A, PA-C 08/23/20 6761    Shon Baton, MD 08/23/20 2321

## 2020-08-23 NOTE — ED Notes (Signed)
Pt provided sandwich bag and snacks

## 2021-10-11 ENCOUNTER — Encounter (HOSPITAL_COMMUNITY): Payer: Self-pay | Admitting: Emergency Medicine

## 2021-10-11 ENCOUNTER — Ambulatory Visit (HOSPITAL_COMMUNITY)
Admission: EM | Admit: 2021-10-11 | Discharge: 2021-10-11 | Disposition: A | Discharge status: Home / Self Care | Payer: Self-pay | Attending: Internal Medicine | Admitting: Internal Medicine

## 2021-10-11 ENCOUNTER — Telehealth (HOSPITAL_COMMUNITY): Payer: Self-pay

## 2021-10-11 DIAGNOSIS — H60503 Unspecified acute noninfective otitis externa, bilateral: Secondary | ICD-10-CM

## 2021-10-11 DIAGNOSIS — H6123 Impacted cerumen, bilateral: Secondary | ICD-10-CM

## 2021-10-11 MED ORDER — CORTISPORIN-TC 3.3-3-10-0.5 MG/ML OT SUSP: 4.0000 [drp] | Freq: Four times a day (QID) | OTIC | 0 refills | Status: DC

## 2021-10-11 MED ORDER — CETIRIZINE HCL 10 MG PO TABS: 10.0000 mg | ORAL_TABLET | Freq: Every day | ORAL | 2 refills | Status: AC

## 2021-10-11 MED ORDER — CETIRIZINE HCL 10 MG PO TABS: 10.0000 mg | ORAL_TABLET | Freq: Every day | ORAL | 2 refills | Status: DC

## 2021-10-11 NOTE — Discharge Instructions (Addendum)
Please 4 drops of the Cortisporin eardrops into each ear every 6 hours for the next 7 days to treat infected ears.   Do not go swimming for the next 7 to 10 days as this infection heals.  Do not place anything else into the ears other than the Cortisporin eardrops.  No peroxide, no cotton swabs, and no Q-tips.  If your symptoms do not improve in the next 4 to 5 days, please return to urgent care for reevaluation.  If your symptoms become severe, please return to the emergency department for further evaluation.  I hope you feel better!

## 2021-10-11 NOTE — ED Provider Notes (Signed)
[facility]    CSN: [id number] Arrival date & time: 10/11/21  1722      History   Chief Complaint Chief Complaint  Patient presents with   Otalgia    HPI Colin Mason is a 46 y.o. male.   Patient presents to urgent care for evaluation of his left-sided ear pain that he has had over the last 2 to 3 weeks.  Patient states he has attempted to use peroxide to clear out his ear and feels as though his ear is very clogged.  He has used Q-tips and cotton balls to the ear as well and attempt to clean them and keep drainage from coming out of his ear.  He has noticed some white drainage over the last few days that became bloody after he used Q-tips to the ear yesterday.  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  Reporting significant pain to the left ear as well.  The right ear sounds muffled, but not as much as the left.  He has also noted drainage that is white and thick/clumpy from the right ear.  Denies recent swimming and submerging his head underwater, but states that he works outside and his ears get very sweaty.  He has not attempted use of any other over-the-counter medications prior to arrival urgent care for his symptoms.  No fever or chills reported.  No sore throat, nasal congestion, eye drainage, neck pain, tinnitus, or dizziness reported.   Otalgia   History reviewed. No pertinent past medical history.  There are no problems to display for this patient.   History reviewed. No pertinent surgical history.     Home Medications    Prior to Admission medications   Medication Sig Start Date End Date Taking? Authorizing Provider  acetaminophen (TYLENOL) 500 MG tablet Take 1,000 mg by mouth every 6 (six) hours as needed for mild pain.    [provider]  cetirizine (ZYRTEC) 10 MG tablet Take 1 tablet (10 mg total) by mouth daily. 10/11/21   Carlisle Beers, FNP   diphenhydrAMINE (BENADRYL) 25 MG tablet Take 75 mg by mouth every 6 (six) hours as needed for itching or allergies.    [provider]  EPINEPHrine 0.3 mg/0.3 mL IJ SOAJ injection Inject 0.3 mg into the muscle as needed for anaphylaxis. 08/23/20   McDonald, Mia A, PA-C  ibuprofen (ADVIL,MOTRIN) 200 MG tablet Take 200 mg by mouth every 6 (six) hours as needed for pain or headache.    [provider]  ofloxacin (FLOXIN) 0.3 % OTIC solution Place 10 drops into the left ear daily for 7 days. 10/13/21 10/20/21  Gustavus Bryant, FNP    Family History No family history on file.  Social History Social History   Tobacco Use   Smoking status: Every Day    Packs/day: 0.25    Types: Cigarettes   Smokeless tobacco: Never  Substance Use Topics   Alcohol use: No   Drug use: Yes    Types: Marijuana     Allergies   Bee venom and Wasp venom   Review of Systems Review of Systems  HENT:  Positive for ear pain.   Per HPI   Physical Exam Triage Vital Signs ED Triage Vitals  Enc Vitals Group     BP 10/11/21 1742 134/90     Pulse Rate 10/11/21 1742 (!) 102  Resp 10/11/21 1742 17     Temp 10/11/21 1742 98 F (36.7 C)     Temp src --      SpO2 10/11/21 1742 93 %     Weight --      Height --      Head Circumference --      Peak Flow --      Pain Score 10/11/21 1741 8     Pain Loc --      Pain Edu? --      Excl. in GC? --    No data found.  Updated Vital Signs BP 134/90 (BP Location: Left Arm)   Pulse (!) 102   Temp 98 F (36.7 C)   Resp 17   SpO2 93%   Visual Acuity Right Eye Distance:   Left Eye Distance:   Bilateral Distance:    Right Eye Near:   Left Eye Near:    Bilateral Near:     Physical Exam Vitals and nursing note reviewed.  Constitutional:      Appearance: Normal appearance. He is not ill-appearing or toxic-appearing.     Comments: Very pleasant patient sitting on exam in position of comfort table in no acute distress.   HENT:     Head:  Normocephalic and atraumatic.     Right Ear: Hearing, ear canal and external ear normal. A middle ear effusion is present.     Left Ear: Hearing and external ear normal.     Ears:     Comments: Right ear: wax present to the right ear canal with small amount of white flaky debris. Wax does not obstruct view of tympanic membrane.  Left ear: bloody drainage present to the left ear. After flushing, ear canal appears very swollen, erythematous, there is white drainage/debris present.  Unable to visualize left tympanic membrane due to amount of swelling and patient has significant decreased hearing to the left ear due to the swelling and infection.     Nose: Nose normal.     Mouth/Throat:     Lips: Pink.     Mouth: Mucous membranes are moist.     Pharynx: No posterior oropharyngeal erythema.  Eyes:     General: Lids are normal. Vision grossly intact. Gaze aligned appropriately.        Right eye: No discharge.        Left eye: No discharge.     Extraocular Movements: Extraocular movements intact.     Conjunctiva/sclera: Conjunctivae normal.     Pupils: Pupils are equal, round, and reactive to light.  Pulmonary:     Effort: Pulmonary effort is normal.  Abdominal:     Palpations: Abdomen is soft.  Musculoskeletal:     Cervical back: Neck supple.  Skin:    General: Skin is warm and dry.     Capillary Refill: Capillary refill takes less than 2 seconds.     Findings: No rash.  Neurological:     General: No focal deficit present.     Mental Status: He is alert and oriented to person, place, and time. Mental status is at baseline.     Cranial Nerves: No dysarthria or facial asymmetry.     Gait: Gait is intact.  Psychiatric:        Mood and Affect: Mood normal.        Speech: Speech normal.        Behavior: Behavior normal.        Thought Content: Thought content normal.  Judgment: Judgment normal.      UC Treatments / Results  Labs (all labs ordered are listed, but only abnormal  results are displayed) Labs Reviewed - No data to display  EKG   Radiology No results found.  Procedures Procedures (including critical care time)  Medications Ordered in UC Medications - No data to display  Initial Impression / Assessment and Plan / UC Course  I have reviewed the triage vital signs and the nursing notes.  Pertinent labs & imaging results that were available during my care of the patient were reviewed by me and considered in my medical decision making (see chart for details).  1.  Acute otitis externa of both ears and bilateral impacted cerumen Otitis externa present bilaterally.  Prescribed Cortisporin eardrops to be used 4 drops into each ear every 6 hours for the next 7 days to treat bilateral otitis externa. Cetirizine 10mg  once daily prescribed to treat mid effusion to right ear and possible mid effusion to left ear. Cetirizine will hopefully provide some symptom relief regarding ear fullness and decreased hearing. Bilateral ear canals flush today. Patient to follow-up with UC or ED in the next few days if symptoms persist or worsen.   Discussed physical exam and available lab work findings in clinic with patient.  Counseled patient regarding appropriate use of medications and potential side effects for all medications recommended or prescribed today. Discussed red flag signs and symptoms of worsening condition,when to call the PCP office, return to urgent care, and when to seek higher level of care in the emergency department. Patient verbalizes understanding and agreement with plan. All questions answered. Patient discharged in stable condition.  Final Clinical Impressions(s) / UC Diagnoses   Final diagnoses:  Acute otitis externa of both ears, unspecified type  Bilateral impacted cerumen     Discharge Instructions      Please 4 drops of the Cortisporin eardrops into each ear every 6 hours for the next 7 days to treat infected ears.   Do not go swimming  for the next 7 to 10 days as this infection heals.  Do not place anything else into the ears other than the Cortisporin eardrops.  No peroxide, no cotton swabs, and no Q-tips.  If your symptoms do not improve in the next 4 to 5 days, please return to urgent care for reevaluation.  If your symptoms become severe, please return to the emergency department for further evaluation.  I hope you feel better!     ED Prescriptions     Medication Sig Dispense Auth. Provider   neomycin-colistin-hydrocortisone-thonzonium (CORTISPORIN-TC) 3.05-19-08-0.5 MG/ML OTIC suspension Place 4 drops into both ears 4 (four) times daily for 7 days. 10 mL 08-25-1998 M, FNP   cetirizine (ZYRTEC) 10 MG tablet Take 1 tablet (10 mg total) by mouth daily. 30 tablet M, FNP      PDMP not reviewed this encounter.   Carlisle Beers, Carlisle Beers 10/14/21 2155

## 2021-10-11 NOTE — ED Triage Notes (Signed)
Pt reports left ear pain for about 3 weeks. Reports had some drainage week ago. Reprots tried putting peroxide and q-tips to clean it this morning.

## 2021-10-13 ENCOUNTER — Ambulatory Visit (HOSPITAL_COMMUNITY)
Admission: EM | Admit: 2021-10-13 | Discharge: 2021-10-13 | Disposition: A | Discharge status: Home / Self Care | Payer: Self-pay | Attending: Internal Medicine | Admitting: Internal Medicine

## 2021-10-13 ENCOUNTER — Encounter (HOSPITAL_COMMUNITY): Payer: Self-pay

## 2021-10-13 DIAGNOSIS — H60392 Other infective otitis externa, left ear: Secondary | ICD-10-CM

## 2021-10-13 MED ORDER — OFLOXACIN 0.3 % OT SOLN: 10.0000 [drp] | Freq: Every day | OTIC | 0 refills | Status: AC

## 2021-10-13 MED ORDER — CIPROFLOXACIN-DEXAMETHASONE 0.3-0.1 % OT SUSP: 4.0000 [drp] | Freq: Two times a day (BID) | OTIC | 0 refills | Status: DC

## 2021-10-13 NOTE — ED Provider Notes (Addendum)
[facility]    CSN: [id number] Arrival date & time: 10/13/21  1721      History   Chief Complaint Chief Complaint  Patient presents with   Otalgia    Left ear    HPI Colin Mason is a 46 y.o. male.   Patient presents with persistent bilateral ear pain. Patient states that he was here on 7/26 and prescribed cetirizine and ear drops for ear infection. He has not been able to pick up the ear drops from the pharmacy because they were not available. Denies fever, body aches, chills.    Otalgia   History reviewed. No pertinent past medical history.  There are no problems to display for this patient.   History reviewed. No pertinent surgical history.     Home Medications    Prior to Admission medications   Medication Sig Start Date End Date Taking? Authorizing Provider  ofloxacin (FLOXIN) 0.3 % OTIC solution Place 10 drops into the left ear daily for 7 days. 10/13/21 10/20/21 Yes Chennel Olivos, Acie Fredrickson, FNP  acetaminophen (TYLENOL) 500 MG tablet Take 1,000 mg by mouth every 6 (six) hours as needed for mild pain.    [provider]  cetirizine (ZYRTEC) 10 MG tablet Take 1 tablet (10 mg total) by mouth daily. 10/11/21   Carlisle Beers, FNP  diphenhydrAMINE (BENADRYL) 25 MG tablet Take 75 mg by mouth every 6 (six) hours as needed for itching or allergies.    [provider]  EPINEPHrine 0.3 mg/0.3 mL IJ SOAJ injection Inject 0.3 mg into the muscle as needed for anaphylaxis. 08/23/20   McDonald, Mia A, PA-C  ibuprofen (ADVIL,MOTRIN) 200 MG tablet Take 200 mg by mouth every 6 (six) hours as needed for pain or headache.    [provider]    Family History History reviewed. No pertinent family history.  Social History Social History   Tobacco Use   Smoking status: Every Day    Packs/day: 0.25    Types: Cigarettes   Smokeless tobacco: Never  Substance Use Topics   Alcohol use: No   Drug use: Yes    Types: Marijuana     Allergies    Bee venom and Wasp venom   Review of Systems Review of Systems Per HPI  Physical Exam Triage Vital Signs ED Triage Vitals [10/13/21 1737]  Enc Vitals Group     BP (!) 151/96     Pulse Rate (!) 110     Resp 16     Temp 98.9 F (37.2 C)     Temp Source Oral     SpO2 94 %     Weight 286 lb 9.6 oz (130 kg)     Height 5\' 11"  (1.803 m)     Head Circumference      Peak Flow      Pain Score 10     Pain Loc      Pain Edu?      Excl. in GC?    No data found.  Updated Vital Signs BP (!) 151/96 (BP Location: Left Arm)   Pulse (!) 110   Temp 98.9 F (37.2 C) (Oral)   Resp 16   Ht 5\' 11"  (1.803 m)   Wt 286 lb 9.6 oz (130 kg)   SpO2 94%   BMI 39.97 kg/m   Visual Acuity Right Eye Distance:   Left Eye Distance:   Bilateral Distance:    Right Eye Near:   Left Eye Near:  Bilateral Near:     Physical Exam Constitutional:      General: He is not in acute distress.    Appearance: Normal appearance. He is not toxic-appearing or diaphoretic.  HENT:     Head: Normocephalic and atraumatic.     Right Ear: Ear canal and external ear normal. No drainage, swelling or tenderness. A middle ear effusion is present. There is no impacted cerumen. No mastoid tenderness. Tympanic membrane is not perforated, erythematous or bulging.     Left Ear: External ear normal.     Ears:     Comments: Right ear canal normal. Mild swelling and erythema located to left external canal. Unable to completely visualize TM due to swelling and exudate.  Eyes:     Extraocular Movements: Extraocular movements intact.     Conjunctiva/sclera: Conjunctivae normal.  Pulmonary:     Effort: Pulmonary effort is normal.  Neurological:     General: No focal deficit present.     Mental Status: He is alert and oriented to person, place, and time. Mental status is at baseline.  Psychiatric:        Mood and Affect: Mood normal.        Behavior: Behavior normal.        Thought Content: Thought content normal.         Judgment: Judgment normal.      UC Treatments / Results  Labs (all labs ordered are listed, but only abnormal results are displayed) Labs Reviewed - No data to display  EKG   Radiology No results found.  Procedures Procedures (including critical care time)  Medications Ordered in UC Medications - No data to display  Initial Impression / Assessment and Plan / UC Course  I have reviewed the triage vital signs and the nursing notes.  Pertinent labs & imaging results that were available during my care of the patient were reviewed by me and considered in my medical decision making (see chart for details).     Patient's pharmacy was called to see what medications were available to treat left otitis externa. I do think that ciprodex or ofloxacin would be safest given that I am not able to fully visualize TM. Although TM does appear intact. Patient does not have insurance and ciprodex is approximately $300 so will prescribe ofloxacin given that it is cheaper. Confirmed that pharmacy has medication. Patient to continue previously prescribe cetirizine as well as it may help with left mild middle ear effusion. Discussed strict return precautions. Patient verbalized understanding and was agreeable with plan. HR is baseline for patient. Patient was also very agitated about medication situation which I think was contributing.   Final Clinical Impressions(s) / UC Diagnoses   Final diagnoses:  Other infective acute otitis externa of left ear     Discharge Instructions      You have been prescribed a different eardrop that is available at the pharmacy.  I did call to check.  Please follow-up if symptoms persist or worsen.    ED Prescriptions     Medication Sig Dispense Auth. Provider   ciprofloxacin-dexamethasone (CIPRODEX) OTIC suspension  (Status: Discontinued) Place 4 drops into the left ear 2 (two) times daily for 7 days. 7.5 mL Tashayla Therien, Rolly Salter E, [place]   ofloxacin (FLOXIN) 0.3 %  OTIC solution Place 10 drops into the left ear daily for 7 days. 5 mL Gustavus Bryant, [place]      PDMP not reviewed this encounter.   Gustavus Bryant, [place] 10/13/21  [address]    Gustavus Bryant, [place] 10/13/21 1807

## 2021-10-13 NOTE — ED Triage Notes (Signed)
Patient states the left ear pain is not better and the pharmacy did not have his RX. States the medication is not in stock.

## 2021-10-13 NOTE — Discharge Instructions (Signed)
You have been prescribed a different eardrop that is available at the pharmacy.  I did call to check.  Please follow-up if symptoms persist or worsen.

## 2021-11-24 ENCOUNTER — Emergency Department (HOSPITAL_COMMUNITY)
Admission: EM | Admit: 2021-11-24 | Discharge: 2021-11-24 | Disposition: A | Discharge status: Home / Self Care | Payer: Self-pay | Attending: Emergency Medicine | Admitting: Emergency Medicine

## 2021-11-24 ENCOUNTER — Encounter (HOSPITAL_COMMUNITY): Payer: Self-pay

## 2021-11-24 DIAGNOSIS — H7292 Unspecified perforation of tympanic membrane, left ear: Secondary | ICD-10-CM | POA: Insufficient documentation

## 2021-11-24 MED ORDER — OFLOXACIN 0.3 % OT SOLN: 5.0000 [drp] | Freq: Two times a day (BID) | OTIC | 0 refills | Status: AC

## 2021-11-24 NOTE — ED Notes (Signed)
All discharge instructions including follow up care and prescriptions reviewed with patient and patient verbalized understanding of same. Patient stable and ambulatory at time of discharge.  

## 2021-11-24 NOTE — ED Provider Triage Note (Signed)
Emergency Medicine Provider Triage Evaluation Note  Colin Mason , a 46 y.o. male  was evaluated in triage.  Pt complains of left ear pain over the last 2 months.  Has seen UC several times regarding this, has been given eardrops and pills for relief.  However states these have not been working.  Does not know the name of these medications.  Yesterday, he got fed up with the ear pain, put hydrogen peroxide in his ear with cotton and slept with it in.  Then woke up this morning and noticed his hearing was gone from the left ear.  Second Q-tip inside the left ear and noticed blood on it.  Denies fevers, chills, or N/V.  Review of Systems  Positive:  Negative: See above  Physical Exam  BP (!) 153/107 (BP Location: Right Arm)   Pulse 99   Temp 98.3 F (36.8 C) (Oral)   Resp 18   SpO2 95%  Gen:   Awake, no distress   Resp:  Normal effort  MSK:   Moves extremities without difficulty  Other:  Cotton placed in the left ear canal, without neck swelling or neck tenderness.  Medical Decision Making  Medically screening exam initiated at 11:29 AM.  Appropriate orders placed.  Verdene Lennert Kitson was informed that the remainder of the evaluation will be completed by another provider, this initial triage assessment does not replace that evaluation, and the importance of remaining in the ED until their evaluation is complete.     Cecil Cobbs, PA-C 11/24/21 1137

## 2021-11-24 NOTE — ED Triage Notes (Signed)
Pt states that he began having L ear pain approximately two months ago. Seen at [facility] multiple times and given abx and ear drops. Pt states that yesterday he began having pain again and and put hydrogen peroxide and cotton ball overnight. This morning when he woke up he felt a fullness and decreased hearing on that side. States that he used a q-tip to clean and caused bleeding in his ear.

## 2021-11-24 NOTE — Discharge Instructions (Addendum)
You have been provided the contact information for a local ENT specialist.  Please call today to set up a follow-up appointment within the next 2 to 3 days for reevaluation continue medical management.  A prescription for antibiotic eardrops has also been provided for you, which has been sent to your pharmacy.  Apply these to your ear -- 5 drops every 12 hours for the next 7 days, until you are able to follow-up with ENT.  Refrain from submerging your ear underwater, getting ear into the ear canal, or forceful internal pressure such as sneezing or blowing your nose.  Practice the "open mouth" sneezing method to help with this as well.  Return to the ED for any new or worsening symptoms as discussed.

## 2021-11-24 NOTE — ED Provider Notes (Signed)
[facility] EMERGENCY DEPARTMENT Provider Note   CSN: [id number] Arrival date & time: 11/24/21  1017     History  Chief Complaint  Patient presents with   Otalgia    Colin Mason is a 46 y.o. male presenting today with ongoing left ear pain over the last 2 months.  Appear to have started with a simple ear infection or cerumen impaction.  Was provided eardrops and "some tablets".  3 weeks later, return for reevaluation and was given the same.  Over the last week, has noticed increasing discomfort, stating "I thought something might be in there".  Last night placed hydrogen peroxide in his ear with some cotton and let it stay overnight.  Woke up this morning and was unable to hear.  Stuck a Q-tip into his ear and noticed blood on it.  Pain now radiates to the head and jaw.  Denies fever, difficulty swallowing, congestion, cough, neck stiffness, or chills.  The history is provided by the patient and medical records.  Otalgia    Home Medications Prior to Admission medications   Medication Sig Start Date End Date Taking? Authorizing Provider  ofloxacin (FLOXIN) 0.3 % OTIC solution Place 5 drops into the left ear 2 (two) times daily. 11/24/21  Yes Cecil Cobbs, PA-C  acetaminophen (TYLENOL) 500 MG tablet Take 1,000 mg by mouth every 6 (six) hours as needed for mild pain.    [provider]  cetirizine (ZYRTEC) 10 MG tablet Take 1 tablet (10 mg total) by mouth daily. 10/11/21   Carlisle Beers, FNP  diphenhydrAMINE (BENADRYL) 25 MG tablet Take 75 mg by mouth every 6 (six) hours as needed for itching or allergies.    [provider]  EPINEPHrine 0.3 mg/0.3 mL IJ SOAJ injection Inject 0.3 mg into the muscle as needed for anaphylaxis. 08/23/20   McDonald, Mia A, PA-C  ibuprofen (ADVIL,MOTRIN) 200 MG tablet Take 200 mg by mouth every 6 (six) hours as needed for pain or headache.    [provider]      Allergies    Bee venom and Wasp venom     Review of Systems   Review of Systems  HENT:  Positive for ear pain.     Physical Exam Updated Vital Signs BP (!) 153/107 (BP Location: Right Arm)   Pulse 99   Temp 98.3 F (36.8 C) (Oral)   Resp 18   Ht 5\' 11"  (1.803 m)   Wt 117.9 kg   SpO2 95%   BMI 36.26 kg/m  Physical Exam Vitals and nursing note reviewed.  Constitutional:      General: He is not in acute distress.    Appearance: He is well-developed. He is not ill-appearing, toxic-appearing or diaphoretic.  HENT:     Head: Normocephalic and atraumatic.     Right Ear: Tympanic membrane, ear canal and external ear normal. There is no impacted cerumen.     Left Ear: External ear normal. Decreased hearing noted. Drainage (Blood) and tenderness (Mild) present. No swelling. There is no impacted cerumen. No foreign body. No mastoid tenderness. Tympanic membrane is perforated.  Eyes:     Conjunctiva/sclera: Conjunctivae normal.  Neck:     Comments: No meningismus or torticollis Cardiovascular:     Rate and Rhythm: Normal rate and regular rhythm.     Heart sounds: No murmur heard. Pulmonary:     Effort: Pulmonary effort is normal. No respiratory distress.     Breath sounds: Normal breath sounds.  Abdominal:     Palpations: Abdomen is soft.     Tenderness: There is no abdominal tenderness.  Musculoskeletal:        General: No swelling.     Cervical back: Neck supple. No rigidity.  Skin:    General: Skin is warm and dry.     Capillary Refill: Capillary refill takes less than 2 seconds.  Neurological:     Mental Status: He is alert and oriented to person, place, and time.     GCS: GCS eye subscore is 4. GCS verbal subscore is 5. GCS motor subscore is 6.     Cranial Nerves: No facial asymmetry.     Comments: No facial palsy appreciated.  Psychiatric:        Mood and Affect: Mood normal.     ED Results / Procedures / Treatments   Labs (all labs ordered are listed, but only abnormal results are displayed) Labs  Reviewed - No data to display  EKG None  Radiology No results found.  Procedures Procedures    Medications Ordered in ED Medications - No data to display  ED Course/ Medical Decision Making/ A&P                           Medical Decision Making  46 y.o. male presents to the ED for concern of Otalgia   This involves an extensive number of treatment options, and is a complaint that carries with it a high risk of complications and morbidity.  The emergent differential diagnosis prior to evaluation includes, but is not limited to: Otitis media, otitis externa, malignant otitis externa, mastoiditis  This is not an exhaustive differential.   Past Medical History / Co-morbidities / Social History: No PMHx Social Determinants of Health include: None  Additional History:  Obtained by chart review.  Notably 7/26 and 7/28 UC notes documenting attempted treatments and physical exam of same complaint  Lab Tests: None  Imaging Studies: None  ED Course: Pt well-appearing on exam.  Pt presents with otalgia and exam consistent with perforated tympanic membrane.  Hx of mild otitis externa over the last month and a half.  Has been treated at [facility], however symptoms persisted.  Last night pt placed hydrogen peroxide in his ear canal overnight with some cotton, woke up in the morning with significant hearing loss in the affected ear.  Then stuck a Q-tip into the ear and it came out bloody.   No chills, fever, or significant swelling.  No purulence appreciated in the ear canal.  Bone structures of the middle ear visualized.  And no concern for acute mastoiditis, malignant otitis externa, meningitis.  No evidence of facial nerve palsy, refractory N/V, acute basilar skull fracture, or ossicular disruption.  Marked hearing loss appreciated.  Due to severity of tympanic membrane perforation, consulted with Dr. Elijah Birk of ENT.  He agreed with plan for antibiotic eardrops, water avoidance precautions, and  close ENT follow-up for reevaluation continue medical management.  Patient reports satisfaction with today's encounter.  Patient in NAD and in good condition at time of discharge.  Disposition: After consideration the patient's encounter today, I do not feel today's workup suggests an emergent condition requiring admission or immediate intervention beyond what has been performed at this time.  Safe for discharge; instructed to return immediately for worsening symptoms, change in symptoms or any other concerns.  I have reviewed the patients home medicines and have made adjustments as needed.  Discussed  course of treatment with the patient, whom demonstrated understanding.  Patient in agreement and has no further questions.    This patient was a shared case encounter with my attending, Dr. Kathrynn Humble, who directly contributed to the proposed treatment course and cosigned this note including patient's presenting symptoms, physical exam, and planned diagnostics and interventions.  Attending physician stated agreement with plan or made changes to plan which were implemented.     This chart was dictated using voice recognition software.  Despite best efforts to proofread, errors can occur which can change the documentation meaning.         Final Clinical Impression(s) / ED Diagnoses Final diagnoses:  Tympanic membrane rupture, left    Rx / DC Orders ED Discharge Orders          Ordered    ofloxacin (FLOXIN) 0.3 % OTIC solution  2 times daily        11/24/21 1222              Prince Rome, Tri [id number]    Varney Biles, MD 11/25/21 [phone]

## 2022-10-18 ENCOUNTER — Other Ambulatory Visit: Payer: Self-pay

## 2022-10-18 ENCOUNTER — Emergency Department (HOSPITAL_COMMUNITY)
Admission: EM | Admit: 2022-10-18 | Discharge: 2022-10-18 | Discharge status: Against Medical Advice | Payer: 59 | Attending: Emergency Medicine | Admitting: Emergency Medicine

## 2022-10-18 ENCOUNTER — Encounter (HOSPITAL_BASED_OUTPATIENT_CLINIC_OR_DEPARTMENT_OTHER): Payer: Self-pay | Admitting: Emergency Medicine

## 2022-10-18 DIAGNOSIS — R0789 Other chest pain: Secondary | ICD-10-CM | POA: Insufficient documentation

## 2022-10-18 DIAGNOSIS — R066 Hiccough: Secondary | ICD-10-CM | POA: Insufficient documentation

## 2022-10-18 DIAGNOSIS — R1013 Epigastric pain: Secondary | ICD-10-CM | POA: Insufficient documentation

## 2022-10-18 DIAGNOSIS — Z5321 Procedure and treatment not carried out due to patient leaving prior to being seen by health care provider: Secondary | ICD-10-CM | POA: Diagnosis not present

## 2022-10-18 DIAGNOSIS — E876 Hypokalemia: Secondary | ICD-10-CM | POA: Insufficient documentation

## 2022-10-18 DIAGNOSIS — R000 Tachycardia, unspecified: Secondary | ICD-10-CM | POA: Insufficient documentation

## 2022-10-18 LAB — CBC
HCT: 37.6 % — ABNORMAL LOW (ref 39.0–52.0)
Hemoglobin: 13.9 g/dL (ref 13.0–17.0)
MCH: 30.7 pg (ref 26.0–34.0)
MCHC: 37 g/dL — ABNORMAL HIGH (ref 30.0–36.0)
MCV: 83 fL (ref 80.0–100.0)
Platelets: 247 10*3/uL (ref 150–400)
RBC: 4.53 MIL/uL (ref 4.22–5.81)
RDW: 14.1 % (ref 11.5–15.5)
WBC: 10.5 10*3/uL (ref 4.0–10.5)
nRBC: 0 % (ref 0.0–0.2)

## 2022-10-18 NOTE — ED Notes (Signed)
Says hiccups have been ongoing since last Thursday. Declined triage. Ambulatory on departure.

## 2022-10-18 NOTE — ED Triage Notes (Signed)
Pt presents from home for hiccups since last Thursday that started making it feel difficult to breathe starting tonight.  Endorses epigastric soreness.  Denies cough, fever  No heart history, no daily meds. Endorses marijuana, last use early today.   He has experienced hiccups before lasting less than a day but has not been evaluated by MD for this.

## 2022-10-19 ENCOUNTER — Emergency Department (HOSPITAL_BASED_OUTPATIENT_CLINIC_OR_DEPARTMENT_OTHER)
Admission: EM | Admit: 2022-10-19 | Discharge: 2022-10-19 | Disposition: A | Discharge status: Home / Self Care | Payer: 59 | Source: Home / Self Care | Attending: Emergency Medicine | Admitting: Emergency Medicine

## 2022-10-19 ENCOUNTER — Emergency Department (HOSPITAL_BASED_OUTPATIENT_CLINIC_OR_DEPARTMENT_OTHER): Payer: 59

## 2022-10-19 DIAGNOSIS — R066 Hiccough: Secondary | ICD-10-CM

## 2022-10-19 DIAGNOSIS — E876 Hypokalemia: Secondary | ICD-10-CM

## 2022-10-19 LAB — TROPONIN I (HIGH SENSITIVITY): Troponin I (High Sensitivity): 3 ng/L (ref <18)

## 2022-10-19 MED ORDER — POTASSIUM CHLORIDE CRYS ER 20 MEQ PO TBCR
40.0000 meq | EXTENDED_RELEASE_TABLET | Freq: Once | ORAL | Status: AC
  Administered 2022-10-19: 40 meq via ORAL
  Filled 2022-10-19: qty 2

## 2022-10-19 MED ORDER — METOCLOPRAMIDE HCL 10 MG PO TABS: 10.0000 mg | ORAL_TABLET | Freq: Three times a day (TID) | ORAL | 0 refills | Status: AC | PRN

## 2022-10-19 MED ORDER — FAMOTIDINE 20 MG PO TABS: 20.0000 mg | ORAL_TABLET | Freq: Two times a day (BID) | ORAL | 0 refills | Status: DC

## 2022-10-19 MED ORDER — POTASSIUM CHLORIDE ER 10 MEQ PO TBCR: 20.0000 meq | EXTENDED_RELEASE_TABLET | Freq: Every day | ORAL | 0 refills | Status: DC

## 2022-10-19 NOTE — ED Provider Notes (Signed)
[place] EMERGENCY DEPARTMENT AT [facility] Provider Note  CSN: [id number] Arrival date & time: 10/18/22 2319  Chief Complaint(s) Hiccups  HPI Colin Mason is a 47 y.o. male with a past medical history listed below who presents to the emergency department for several days of intermittent hiccuping.  Improves with activity.  Usually worse at rest.  States that hiccups at times becomes so severe he feels short of breath.  He denies any recent fevers or infections.  No coughing or congestion.  No nausea or vomiting.  No chest pain or shortness of breath without the hiccups.  He does endorse epigastric and chest wall pain when he comes to become frequent or severe.  HPI  Past Medical History History reviewed. No pertinent past medical history. There are no problems to display for this patient.  Home Medication(s) Prior to Admission medications   Medication Sig Start Date End Date Taking? Authorizing Provider  potassium chloride (KLOR-CON) 10 MEQ tablet Take 2 tablets (20 mEq total) by mouth daily. 10/19/22  Yes Marco Adelson, Amadeo Garnet, MD  acetaminophen (TYLENOL) 500 MG tablet Take 1,000 mg by mouth every 6 (six) hours as needed for mild pain.    [provider]  cetirizine (ZYRTEC) 10 MG tablet Take 1 tablet (10 mg total) by mouth daily. 10/11/21   Carlisle Beers, FNP  diphenhydrAMINE (BENADRYL) 25 MG tablet Take 75 mg by mouth every 6 (six) hours as needed for itching or allergies.    [provider]  EPINEPHrine 0.3 mg/0.3 mL IJ SOAJ injection Inject 0.3 mg into the muscle as needed for anaphylaxis. 08/23/20   McDonald, Mia A, PA-C  ibuprofen (ADVIL,MOTRIN) 200 MG tablet Take 200 mg by mouth every 6 (six) hours as needed for pain or headache.    [provider]  ofloxacin (FLOXIN) 0.3 % OTIC solution Place 5 drops into the left ear 2 (two) times daily. 11/24/21   Cecil Cobbs, PA-C                                                                                                                                     Allergies Bee venom and Wasp venom  Review of Systems Review of Systems As noted in HPI  Physical Exam Vital Signs  I have reviewed the triage vital signs BP (!) 147/100   Pulse 86   Temp 98.3 F (36.8 C) (Oral)   Resp 18   Wt 117 kg   SpO2 98%   BMI 35.98 kg/m   Physical Exam Vitals reviewed.  Constitutional:      General: He is not in acute distress.    Appearance: He is well-developed. He is not diaphoretic.  HENT:     Head: Normocephalic and atraumatic.     Right Ear: External ear normal.     Left Ear: External ear normal.     Nose: Nose normal.     Mouth/Throat:  Mouth: Mucous membranes are moist.  Eyes:     General: No scleral icterus.    Conjunctiva/sclera: Conjunctivae normal.  Neck:     Trachea: Phonation normal.  Cardiovascular:     Rate and Rhythm: Normal rate and regular rhythm.  Pulmonary:     Effort: Pulmonary effort is normal. No respiratory distress.     Breath sounds: No stridor.  Abdominal:     General: There is no distension.  Musculoskeletal:        General: Normal range of motion.     Cervical back: Normal range of motion.  Neurological:     Mental Status: He is alert and oriented to person, place, and time.  Psychiatric:        Behavior: Behavior normal.     ED Results and Treatments Labs (all labs ordered are listed, but only abnormal results are displayed) Labs Reviewed  COMPREHENSIVE METABOLIC PANEL - Abnormal; Notable for the following components:      Result Value   Potassium 3.3 (*)    Glucose, Bld 157 (*)    All other components within normal limits  CBC - Abnormal; Notable for the following components:   HCT 37.6 (*)    MCHC 37.0 (*)    All other components within normal limits  URINALYSIS, ROUTINE W REFLEX MICROSCOPIC - Abnormal; Notable for the following components:   Ketones, ur TRACE (*)    Protein, ur TRACE (*)    All other components within  normal limits  LIPASE, BLOOD  TROPONIN I (HIGH SENSITIVITY)  TROPONIN I (HIGH SENSITIVITY)                                                                                                                         EKG  EKG Interpretation Date/Time:  Thursday October 18 2022 23:31:48 EDT Ventricular Rate:  104 PR Interval:  132 QRS Duration:  74 QT Interval:  348 QTC Calculation: 457 R Axis:   58  Text Interpretation: Sinus tachycardia Otherwise normal ECG When compared with ECG of 18-May-2012 05:39, No significant change was found Confirmed by Drema Pry [phone]) on 10/19/2022 4:29:08 AM       Radiology DG Chest Port 1 View  Result Date: 10/19/2022 CLINICAL DATA:  Hiccups. EXAM: PORTABLE CHEST 1 VIEW COMPARISON:  07/18/2012 FINDINGS: Normal heart size and mediastinal contours. No acute infiltrate or edema. No effusion or pneumothorax. No acute osseous findings. Artifact from EKG leads. Under penetrated visualization of the lower lungs. IMPRESSION: Stable portable chest.  No evidence of active disease Electronically Signed   By: Tiburcio Pea M.D.   On: 10/19/2022 05:26    Medications Ordered in ED Medications  potassium chloride SA (KLOR-CON M) CR tablet 40 mEq (40 mEq Oral Given 10/19/22 0452)   Procedures Procedures  (including critical care time) Medical Decision Making / ED Course   Medical Decision Making Amount and/or Complexity of Data Reviewed Labs: ordered. Decision-making details documented in ED Course. Radiology: ordered and independent  interpretation performed. Decision-making details documented in ED Course. ECG/medicine tests: ordered and independent interpretation performed. Decision-making details documented in ED Course.  Risk Prescription drug management.    Hiccups  Currently asymptomatic. Potential causes of hiccups assessed and included in differential.  Noted below.  CBC without leukocytosis or anemia CMP with mild hypokalemia which may be  contributory.  Mild hyperglycemia without DKA.  No renal sufficiency.  No evidence of bili obstruction or pancreatitis. EKG without acute ischemic changes, dysrhythmias or blocks. Serial troponins negative x 2 ruling out ACS and is atypical presentation. Chest x-ray without evidence of pneumonia, pneumothorax, pulmonary edema or pleural effusions.  Supportive management recommended.  Will replete potassium orally    Final Clinical Impression(s) / ED Diagnoses Final diagnoses:  Hiccups  Hypokalemia   The patient appears reasonably screened and/or stabilized for discharge and I doubt any other medical condition or other [facility] requiring further screening, evaluation, or treatment in the ED at this time. I have discussed the findings, Dx and Tx plan with the patient/family who expressed understanding and agree(s) with the plan. Discharge instructions discussed at length. The patient/family was given strict return precautions who verbalized understanding of the instructions. No further questions at time of discharge.  Disposition: Discharge  Condition: Good  ED Discharge Orders          Ordered    potassium chloride (KLOR-CON) 10 MEQ tablet  Daily        10/19/22 0541              Follow Up: Primary care provider  Call  to schedule an appointment for close follow up    This chart was dictated using voice recognition software.  Despite best efforts to proofread,  errors can occur which can change the documentation meaning.    Nira Conn, MD 10/19/22 [phone]

## 2022-10-19 NOTE — ED Notes (Signed)
Patient verbalizes understanding of discharge instructions. Opportunity for questioning and answers were provided. Armband removed by staff, pt discharged from ED. Ambulated out to lobby  

## 2023-04-06 ENCOUNTER — Encounter (HOSPITAL_COMMUNITY): Payer: Self-pay

## 2023-04-06 ENCOUNTER — Other Ambulatory Visit: Payer: Self-pay

## 2023-04-06 ENCOUNTER — Emergency Department (HOSPITAL_COMMUNITY): Payer: Self-pay

## 2023-04-06 ENCOUNTER — Emergency Department (HOSPITAL_COMMUNITY)
Admission: EM | Admit: 2023-04-06 | Discharge: 2023-04-06 | Disposition: A | Discharge status: Home / Self Care | Payer: Self-pay | Attending: Emergency Medicine | Admitting: Emergency Medicine

## 2023-04-06 DIAGNOSIS — S82842A Displaced bimalleolar fracture of left lower leg, initial encounter for closed fracture: Secondary | ICD-10-CM | POA: Insufficient documentation

## 2023-04-06 DIAGNOSIS — W010XXA Fall on same level from slipping, tripping and stumbling without subsequent striking against object, initial encounter: Secondary | ICD-10-CM | POA: Insufficient documentation

## 2023-04-06 MED ORDER — KETOROLAC TROMETHAMINE 60 MG/2ML IM SOLN
60.0000 mg | Freq: Once | INTRAMUSCULAR | Status: AC
  Administered 2023-04-06: 60 mg via INTRAMUSCULAR
  Filled 2023-04-06: qty 2

## 2023-04-06 MED ORDER — IBUPROFEN 400 MG PO TABS
600.0000 mg | ORAL_TABLET | Freq: Once | ORAL | Status: AC
  Administered 2023-04-06: 600 mg via ORAL
  Filled 2023-04-06: qty 1

## 2023-04-06 MED ORDER — OXYCODONE-ACETAMINOPHEN 5-325 MG PO TABS: 1.0000 | ORAL_TABLET | Freq: Four times a day (QID) | ORAL | 0 refills | Status: DC | PRN

## 2023-04-06 MED ORDER — MELOXICAM 15 MG PO TABS: 15.0000 mg | ORAL_TABLET | Freq: Every day | ORAL | 0 refills | Status: AC

## 2023-04-06 NOTE — ED Triage Notes (Signed)
Pt slipped on ice this am and hurt left ankle. Pt has swelling to left ankle, +PMS distal to site.

## 2023-04-06 NOTE — ED Provider Triage Note (Signed)
Emergency Medicine Provider Triage Evaluation Note  Colin Mason , a 48 y.o. male  was evaluated in triage.  Pt complains of 15/10 left ankle pain following slipping and falling on ice at 1028. Heard snap following landing on butt. No head injury. Needed assistance from brother to get up  No OTC medication PTA  Review of Systems  Positive: Left ankle pain Negative: Head injury, LOC  Physical Exam  BP (!) 137/98 (BP Location: Right Arm)   Pulse 94   Temp 99.1 F (37.3 C)   Resp 19   SpO2 97%  Gen:   Awake, no distress   Resp:  Normal effort  MSK:   Moves extremities without difficulty  Other:  Left ankle swelling and diffuse TTP  Medical Decision Making  Medically screening exam initiated at 12:33 PM.  Appropriate orders placed.  Colin Mason was informed that the remainder of the evaluation will be completed by another provider, this initial triage assessment does not replace that evaluation, and the importance of remaining in the ED until their evaluation is complete.  Tylenol and imaging ordered   Colin Sheen, PA 04/06/23 1236

## 2023-04-06 NOTE — ED Provider Notes (Signed)
Colin Mason Provider Note   CSN: [id number] Arrival date & time: 04/06/23  1113     History  Chief Complaint  Patient presents with   Ankle Pain    Colin Mason is a 48 y.o. male.   Ankle Pain Associated symptoms: no fever    This patient is a 48 year old male presenting after having a slip and fall injuring his left ankle which occurred this morning.  He reports he was on ice when he slipped and landed awkwardly, his ankle is severely painful and swollen, no other injuries.  He denies being able to ambulate on this prior to arrival because of pain    Home Medications Prior to Admission medications   Medication Sig Start Date End Date Taking? Authorizing Provider  meloxicam (MOBIC) 15 MG tablet Take 1 tablet (15 mg total) by mouth daily for 14 days. 04/06/23 04/20/23 Yes Eber Hong, MD  oxyCODONE-acetaminophen (PERCOCET/ROXICET) 5-325 MG tablet Take 1 tablet by mouth every 6 (six) hours as needed for severe pain (pain score 7-10). 04/06/23  Yes Eber Hong, MD  acetaminophen (TYLENOL) 500 MG tablet Take 1,000 mg by mouth every 6 (six) hours as needed for mild pain.    [provider]  cetirizine (ZYRTEC) 10 MG tablet Take 1 tablet (10 mg total) by mouth daily. 10/11/21   Carlisle Beers, FNP  diphenhydrAMINE (BENADRYL) 25 MG tablet Take 75 mg by mouth every 6 (six) hours as needed for itching or allergies.    [provider]  EPINEPHrine 0.3 mg/0.3 mL IJ SOAJ injection Inject 0.3 mg into the muscle as needed for anaphylaxis. 08/23/20   McDonald, Mia A, PA-C  famotidine (PEPCID) 20 MG tablet Take 1 tablet (20 mg total) by mouth 2 (two) times daily. 10/19/22   Nira Conn, MD  ibuprofen (ADVIL,MOTRIN) 200 MG tablet Take 200 mg by mouth every 6 (six) hours as needed for pain or headache.    [provider]  metoCLOPramide (REGLAN) 10 MG tablet Take 1 tablet (10 mg total) by mouth every 8 (eight)  hours as needed (hiccups). 10/19/22   Nira Conn, MD  ofloxacin (FLOXIN) 0.3 % OTIC solution Place 5 drops into the left ear 2 (two) times daily. 11/24/21   Cecil Cobbs, PA-C  potassium chloride (KLOR-CON) 10 MEQ tablet Take 2 tablets (20 mEq total) by mouth daily. 10/19/22   Cardama, Amadeo Garnet, MD      Allergies    Bee venom and Wasp venom    Review of Systems   Review of Systems  Constitutional:  Negative for fever.  Musculoskeletal:  Positive for joint swelling.  Neurological:  Negative for weakness and numbness.    Physical Exam Updated Vital Signs BP (!) 137/98 (BP Location: Right Arm)   Pulse 94   Temp 99.1 F (37.3 C)   Resp 19   Ht 1.803 m (5\' 11" )   Wt 117 kg   SpO2 97%   BMI 35.98 kg/m  Physical Exam Vitals and nursing note reviewed.  Constitutional:      Appearance: He is well-developed. He is not diaphoretic.  HENT:     Head: Normocephalic and atraumatic.  Eyes:     General:        Right eye: No discharge.        Left eye: No discharge.     Conjunctiva/sclera: Conjunctivae normal.  Pulmonary:     Effort: Pulmonary effort is normal. No respiratory distress.  Musculoskeletal:        General: Swelling, tenderness and signs of injury present.     Right lower leg: No edema.     Left lower leg: No edema.     Comments: Upper extremities and right lower extremity are normal, left lower extremity has swelling and tenderness about the ankle both medial and lateral.  There is no tenderness over the base of the fifth metatarsal  Skin:    General: Skin is warm and dry.     Findings: No erythema or rash.  Neurological:     Mental Status: He is alert.     Coordination: Coordination normal.     ED Results / Procedures / Treatments   Labs (all labs ordered are listed, but only abnormal results are displayed) Labs Reviewed - No data to display  EKG None  Radiology DG Ankle Complete Left Result Date: 04/06/2023 CLINICAL DATA:  Left ankle pain  after falling on ice. EXAM: LEFT ANKLE COMPLETE - 3+ VIEW COMPARISON:  None Available. FINDINGS: Acute, comminuted, obliquely oriented fracture of the distal fibula is identified. Mild medial and posterior displacement of the distal fracture fragments. Displaced fracture off the medial malleolus is also identified. Widening of the ankle mortise by approximately 1.1 cm noted. Diffuse soft tissue scratch set mild diffuse soft tissue edema. IMPRESSION: 1. Acute, comminuted, displaced fractures of the distal fibula and medial malleolus. 2. Widening of the ankle mortise by approximately 1.1 cm. Electronically Signed   By: Colin Mason M.D.   On: 04/06/2023 13:27    Procedures Procedures    Medications Ordered in ED Medications  ibuprofen (ADVIL) tablet 600 mg (600 mg Oral Given 04/06/23 1243)  ketorolac (TORADOL) injection 60 mg (60 mg Intramuscular Given 04/06/23 1401)    ED Course/ Medical Decision Making/ A&P                                 Medical Decision Making Risk Prescription drug management.    This patient presents to the ED for concern of ankle injury differential diagnosis includes fracture, dislocation, sprain    Additional history obtained:  Additional history obtained from medical record External records from outside source obtained and reviewed including no recent visits   Lab Tests:  I Ordered, and personally interpreted labs.  The pertinent results include: Not indicated   Imaging Studies ordered:  I ordered imaging studies including x-ray of the left ankle I independently visualized and interpreted imaging which showed bimalleolar fracture with an unstable ankle mortise I agree with the radiologist interpretation   Medicines ordered and prescription drug management:  I ordered medication including Percocet for pain Reevaluation of the patient after these medicines showed that the patient improved I have reviewed the patients home medicines and have made  adjustments as needed   Problem List / ED Course:  Splinted by orthopedic technician, patient's pain is well-controlled after medications   Social Determinants of Health:  none   I discussed the care with the orthopedist Dr. Blanchie Dessert, will see the patient in clinic on Tuesday and agrees with the plan of immobilize, crutches, nonweightbearing, pain control, follow-up.        Final Clinical Impression(s) / ED Diagnoses Final diagnoses:  Bimalleolar ankle fracture, left, closed, initial encounter    Rx / DC Orders ED Discharge Orders          Ordered    meloxicam (MOBIC) 15 MG tablet  Daily        04/06/23 1502    oxyCODONE-acetaminophen (PERCOCET/ROXICET) 5-325 MG tablet  Every 6 hours PRN        04/06/23 1502              Eber Hong, MD 04/06/23 1505

## 2023-04-06 NOTE — ED Notes (Signed)
Patient Alert and oriented to baseline. Stable and ambulatory to baseline. Patient verbalized understanding of the discharge instructions.  Patient belongings were taken by the patient.   

## 2023-04-06 NOTE — Discharge Instructions (Addendum)
The surgeon will be able to see you on Tuesday, please see the phone number above, please call the office to find out what time you will need to go on Tuesday morning.  They will be able to help plan the surgery to fix this.  In the meantime please keep your foot elevated, ice packs over the splint, take the pain medication exactly as prescribed  Try to avoid taking Tylenol or ibuprofen or Mobic until after your surgery, the Percocet has been prescribed due to the fracture to help with the pain but this may cause constipation or nausea.  Make sure you take a stool softener while taking this medication  Opiate medications such as Percocet or Vicodin or morphine are very strong pain medications that are also very addictive if they are taken for too long, even a single dose can predispose someone to become addicted so be very careful when taking this medication.  You should take the smallest amount possible to relieve your pain, these medications may cause constipation or nausea, please follow-up with your doctor if you are having the need for ongoing pain control despite these medications.  Additionally please be aware that these medications may cause sedation or sleepiness or alter judgment so you should not take this if you are driving a vehicle or operating heavy machinery or taking care of small children.  Thank you for allowing [place] to treat you in the emergency department today.  After reviewing your examination and potential testing that was done it appears that you are safe to go home.  I would like for you to follow-up with your doctor within the next several days, have them obtain your records and follow-up with them to review all potential tests and results from your visit.  If you should develop severe or worsening symptoms return to the emergency department immediately

## 2023-04-06 NOTE — Progress Notes (Signed)
Orthopedic Tech Progress Note Patient Details:  Colin Mason 26-Dec-1975 [id number] Applied short leg and stirrup splint. Also gave pt instructions on how to use crutches per order.  Ortho Devices Type of Ortho Device: Crutches, Short leg splint Ortho Device/Splint Location: LLE Ortho Device/Splint Interventions: Ordered, Application, Adjustment   Post Interventions Patient Tolerated: Well Instructions Provided: Adjustment of device, Care of device  Blase Mess 04/06/2023, 3:43 PM

## 2023-04-09 ENCOUNTER — Other Ambulatory Visit: Payer: Self-pay

## 2023-04-09 ENCOUNTER — Encounter (HOSPITAL_BASED_OUTPATIENT_CLINIC_OR_DEPARTMENT_OTHER): Payer: Self-pay | Admitting: Orthopedic Surgery

## 2023-04-10 ENCOUNTER — Ambulatory Visit: Payer: Self-pay | Admitting: Emergency Medicine

## 2023-04-10 DIAGNOSIS — M25572 Pain in left ankle and joints of left foot: Secondary | ICD-10-CM

## 2023-04-10 DIAGNOSIS — S82842A Displaced bimalleolar fracture of left lower leg, initial encounter for closed fracture: Secondary | ICD-10-CM

## 2023-04-14 NOTE — Anesthesia Preprocedure Evaluation (Signed)
Anesthesia Evaluation  Patient identified by MRN, date of birth, ID band Patient awake    Reviewed: Allergy & Precautions, H&P , NPO status , Patient's Chart, lab work & pertinent test results  Airway Mallampati: III  TM Distance: >3 FB Neck ROM: Full    Dental no notable dental hx. (+) Teeth Intact, Dental Advisory Given   Pulmonary Current Smoker and Patient abstained from smoking.   Pulmonary exam normal breath sounds clear to auscultation       Cardiovascular Exercise Tolerance: Good negative cardio ROS Normal cardiovascular exam Rhythm:Regular Rate:Normal     Neuro/Psych negative neurological ROS  negative psych ROS   GI/Hepatic negative GI ROS, Neg liver ROS,GERD  Medicated,,  Endo/Other  negative endocrine ROS    Renal/GU negative Renal ROS  negative genitourinary   Musculoskeletal negative musculoskeletal ROS (+)    Abdominal   Peds negative pediatric ROS (+)  Hematology negative hematology ROS (+)   Anesthesia Other Findings   Reproductive/Obstetrics negative OB ROS                             Anesthesia Physical Anesthesia Plan  ASA: 3  Anesthesia Plan: General and Regional   Post-op Pain Management: Minimal or no pain anticipated, Tylenol PO (pre-op)*, Celebrex PO (pre-op)* and Regional block*   Induction: Intravenous  PONV Risk Score and Plan: Ondansetron, Dexamethasone and Treatment may vary due to age or medical condition  Airway Management Planned: LMA  Additional Equipment: None  Intra-op Plan:   Post-operative Plan: Extubation in OR  Informed Consent: I have reviewed the patients History and Physical, chart, labs and discussed the procedure including the risks, benefits and alternatives for the proposed anesthesia with the patient or authorized representative who has indicated his/her understanding and acceptance.     Dental advisory given  Plan  Discussed with: Anesthesiologist and CRNA  Anesthesia Plan Comments: (Discussed both nerve block for pain relief post-op and GA; including NV, sore throat, dental injury, and pulmonary complications)        Anesthesia Quick Evaluation

## 2023-04-15 ENCOUNTER — Ambulatory Visit (HOSPITAL_BASED_OUTPATIENT_CLINIC_OR_DEPARTMENT_OTHER)
Admission: RE | Admit: 2023-04-15 | Discharge: 2023-04-15 | Disposition: A | Discharge status: Home / Self Care | Payer: Self-pay | Attending: Orthopedic Surgery | Admitting: Orthopedic Surgery

## 2023-04-15 ENCOUNTER — Other Ambulatory Visit: Payer: Self-pay

## 2023-04-15 ENCOUNTER — Ambulatory Visit (HOSPITAL_COMMUNITY): Payer: Self-pay

## 2023-04-15 ENCOUNTER — Ambulatory Visit (HOSPITAL_BASED_OUTPATIENT_CLINIC_OR_DEPARTMENT_OTHER): Payer: Self-pay | Admitting: Anesthesiology

## 2023-04-15 ENCOUNTER — Encounter (HOSPITAL_BASED_OUTPATIENT_CLINIC_OR_DEPARTMENT_OTHER)
Admission: RE | Disposition: A | Discharge status: Home / Self Care | Payer: Self-pay | Source: Home / Self Care | Attending: Orthopedic Surgery

## 2023-04-15 ENCOUNTER — Encounter (HOSPITAL_BASED_OUTPATIENT_CLINIC_OR_DEPARTMENT_OTHER): Payer: Self-pay | Admitting: Orthopedic Surgery

## 2023-04-15 DIAGNOSIS — S82852A Displaced trimalleolar fracture of left lower leg, initial encounter for closed fracture: Secondary | ICD-10-CM | POA: Insufficient documentation

## 2023-04-15 DIAGNOSIS — K219 Gastro-esophageal reflux disease without esophagitis: Secondary | ICD-10-CM | POA: Insufficient documentation

## 2023-04-15 DIAGNOSIS — Z79899 Other long term (current) drug therapy: Secondary | ICD-10-CM | POA: Insufficient documentation

## 2023-04-15 DIAGNOSIS — M25572 Pain in left ankle and joints of left foot: Secondary | ICD-10-CM

## 2023-04-15 DIAGNOSIS — S82892A Other fracture of left lower leg, initial encounter for closed fracture: Secondary | ICD-10-CM

## 2023-04-15 DIAGNOSIS — W000XXA Fall on same level due to ice and snow, initial encounter: Secondary | ICD-10-CM | POA: Insufficient documentation

## 2023-04-15 DIAGNOSIS — F17210 Nicotine dependence, cigarettes, uncomplicated: Secondary | ICD-10-CM | POA: Insufficient documentation

## 2023-04-15 DIAGNOSIS — S82842A Displaced bimalleolar fracture of left lower leg, initial encounter for closed fracture: Secondary | ICD-10-CM

## 2023-04-15 HISTORY — PX: ORIF ANKLE FRACTURE: SHX5408

## 2023-04-15 HISTORY — DX: Gastro-esophageal reflux disease without esophagitis: K21.9

## 2023-04-15 SURGERY — OPEN REDUCTION INTERNAL FIXATION (ORIF) ANKLE FRACTURE
Anesthesia: Regional | Site: Ankle | Laterality: Left

## 2023-04-15 MED ORDER — KETOROLAC TROMETHAMINE 30 MG/ML IJ SOLN
INTRAMUSCULAR | Status: AC
  Filled 2023-04-15: qty 1

## 2023-04-15 MED ORDER — CEFAZOLIN SODIUM-DEXTROSE 2-4 GM/100ML-% IV SOLN
2.0000 g | INTRAVENOUS | Status: AC
  Administered 2023-04-15: 3 g via INTRAVENOUS

## 2023-04-15 MED ORDER — VANCOMYCIN HCL 500 MG IV SOLR
INTRAVENOUS | Status: AC
  Filled 2023-04-15: qty 10

## 2023-04-15 MED ORDER — MIDAZOLAM HCL 2 MG/2ML IJ SOLN
INTRAMUSCULAR | Status: AC
Start: 2023-04-15 — End: ?
  Filled 2023-04-15: qty 2

## 2023-04-15 MED ORDER — ACETAMINOPHEN 500 MG PO TABS: 1000.0000 mg | ORAL_TABLET | Freq: Once | ORAL | Status: DC

## 2023-04-15 MED ORDER — ASPIRIN 81 MG PO TBEC: 81.0000 mg | DELAYED_RELEASE_TABLET | Freq: Two times a day (BID) | ORAL | Status: AC

## 2023-04-15 MED ORDER — ROCURONIUM BROMIDE 10 MG/ML (PF) SYRINGE
PREFILLED_SYRINGE | INTRAVENOUS | Status: AC
  Filled 2023-04-15: qty 10

## 2023-04-15 MED ORDER — FENTANYL CITRATE (PF) 100 MCG/2ML IJ SOLN: 25.0000 ug | INTRAMUSCULAR | Status: DC | PRN

## 2023-04-15 MED ORDER — ONDANSETRON HCL 4 MG/2ML IJ SOLN
INTRAMUSCULAR | Status: DC | PRN
  Administered 2023-04-15: 4 mg via INTRAVENOUS

## 2023-04-15 MED ORDER — DEXAMETHASONE SODIUM PHOSPHATE 4 MG/ML IJ SOLN
INTRAMUSCULAR | Status: DC | PRN
  Administered 2023-04-15: 10 mg via INTRAVENOUS

## 2023-04-15 MED ORDER — MEPERIDINE HCL 25 MG/ML IJ SOLN: 6.2500 mg | INTRAMUSCULAR | Status: DC | PRN

## 2023-04-15 MED ORDER — OXYCODONE HCL 5 MG/5ML PO SOLN: 5.0000 mg | Freq: Once | ORAL | Status: DC | PRN

## 2023-04-15 MED ORDER — FENTANYL CITRATE (PF) 100 MCG/2ML IJ SOLN
INTRAMUSCULAR | Status: AC
  Filled 2023-04-15: qty 2

## 2023-04-15 MED ORDER — FENTANYL CITRATE (PF) 100 MCG/2ML IJ SOLN
100.0000 ug | Freq: Once | INTRAMUSCULAR | Status: AC
  Administered 2023-04-15: 100 ug via INTRAVENOUS

## 2023-04-15 MED ORDER — CELECOXIB 200 MG PO CAPS
200.0000 mg | ORAL_CAPSULE | Freq: Once | ORAL | Status: AC
  Administered 2023-04-15: 200 mg via ORAL

## 2023-04-15 MED ORDER — CEFAZOLIN SODIUM-DEXTROSE 1-4 GM/50ML-% IV SOLN
INTRAVENOUS | Status: AC
  Filled 2023-04-15: qty 50

## 2023-04-15 MED ORDER — HYDROMORPHONE HCL 1 MG/ML IJ SOLN
INTRAMUSCULAR | Status: DC | PRN
Start: 2023-04-15 — End: 2023-04-16
  Administered 2023-04-15: .5 mg via INTRAVENOUS

## 2023-04-15 MED ORDER — PROPOFOL 10 MG/ML IV BOLUS
INTRAVENOUS | Status: DC | PRN
  Administered 2023-04-15: 200 mg via INTRAVENOUS

## 2023-04-15 MED ORDER — LACTATED RINGERS IV SOLN: INTRAVENOUS | Status: DC

## 2023-04-15 MED ORDER — CELECOXIB 200 MG PO CAPS
ORAL_CAPSULE | ORAL | Status: AC
  Filled 2023-04-15: qty 1

## 2023-04-15 MED ORDER — BUPIVACAINE LIPOSOME 1.3 % IJ SUSP
INTRAMUSCULAR | Status: DC | PRN
  Administered 2023-04-15: 10 mL via PERINEURAL

## 2023-04-15 MED ORDER — GLYCOPYRROLATE PF 0.2 MG/ML IJ SOSY
PREFILLED_SYRINGE | INTRAMUSCULAR | Status: AC
  Filled 2023-04-15: qty 1

## 2023-04-15 MED ORDER — DEXMEDETOMIDINE HCL IN NACL 80 MCG/20ML IV SOLN
INTRAVENOUS | Status: DC | PRN
  Administered 2023-04-15: 8 ug via INTRAVENOUS

## 2023-04-15 MED ORDER — ACETAMINOPHEN 160 MG/5ML PO SOLN: 325.0000 mg | ORAL | Status: DC | PRN

## 2023-04-15 MED ORDER — LIDOCAINE HCL (CARDIAC) PF 100 MG/5ML IV SOSY
PREFILLED_SYRINGE | INTRAVENOUS | Status: DC | PRN
  Administered 2023-04-15: 80 mg via INTRAVENOUS

## 2023-04-15 MED ORDER — ONDANSETRON HCL 4 MG/2ML IJ SOLN
INTRAMUSCULAR | Status: AC
  Filled 2023-04-15: qty 2

## 2023-04-15 MED ORDER — DEXAMETHASONE SODIUM PHOSPHATE 10 MG/ML IJ SOLN: 8.0000 mg | Freq: Once | INTRAMUSCULAR | Status: DC

## 2023-04-15 MED ORDER — SODIUM CHLORIDE 0.9 % IV SOLN: INTRAVENOUS | Status: DC | PRN

## 2023-04-15 MED ORDER — LIDOCAINE 2% (20 MG/ML) 5 ML SYRINGE
INTRAMUSCULAR | Status: AC
  Filled 2023-04-15: qty 5

## 2023-04-15 MED ORDER — FENTANYL CITRATE (PF) 100 MCG/2ML IJ SOLN
INTRAMUSCULAR | Status: DC | PRN
  Administered 2023-04-15 (×2): 50 ug via INTRAVENOUS

## 2023-04-15 MED ORDER — ROPIVACAINE HCL 5 MG/ML IJ SOLN
INTRAMUSCULAR | Status: DC | PRN
  Administered 2023-04-15: 15 mL via PERINEURAL

## 2023-04-15 MED ORDER — 0.9 % SODIUM CHLORIDE (POUR BTL) OPTIME
TOPICAL | Status: DC | PRN
  Administered 2023-04-15: 1000 mL

## 2023-04-15 MED ORDER — OXYCODONE HCL 5 MG PO TABS
5.0000 mg | ORAL_TABLET | Freq: Once | ORAL | Status: DC | PRN
Start: 2023-04-15 — End: 2023-04-15

## 2023-04-15 MED ORDER — ACETAMINOPHEN 500 MG PO TABS
1000.0000 mg | ORAL_TABLET | Freq: Once | ORAL | Status: AC
  Administered 2023-04-15: 1000 mg via ORAL

## 2023-04-15 MED ORDER — ACETAMINOPHEN 500 MG PO TABS
ORAL_TABLET | ORAL | Status: AC
  Filled 2023-04-15: qty 2

## 2023-04-15 MED ORDER — DEXAMETHASONE SODIUM PHOSPHATE 10 MG/ML IJ SOLN
INTRAMUSCULAR | Status: AC
  Filled 2023-04-15: qty 1

## 2023-04-15 MED ORDER — HYDROMORPHONE HCL 1 MG/ML IJ SOLN
INTRAMUSCULAR | Status: AC
  Filled 2023-04-15: qty 0.5

## 2023-04-15 MED ORDER — MIDAZOLAM HCL 2 MG/2ML IJ SOLN
2.0000 mg | Freq: Once | INTRAMUSCULAR | Status: AC
  Administered 2023-04-15: 2 mg via INTRAVENOUS

## 2023-04-15 MED ORDER — GLYCOPYRROLATE PF 0.2 MG/ML IJ SOSY
PREFILLED_SYRINGE | INTRAMUSCULAR | Status: DC | PRN
  Administered 2023-04-15: .2 mg via INTRAVENOUS

## 2023-04-15 MED ORDER — KETOROLAC TROMETHAMINE 30 MG/ML IJ SOLN
INTRAMUSCULAR | Status: DC | PRN
  Administered 2023-04-15: 30 mg via INTRAVENOUS

## 2023-04-15 MED ORDER — BUPIVACAINE HCL (PF) 0.5 % IJ SOLN
INTRAMUSCULAR | Status: DC | PRN
  Administered 2023-04-15: 10 mL via PERINEURAL

## 2023-04-15 MED ORDER — VANCOMYCIN HCL 500 MG IV SOLR
INTRAVENOUS | Status: DC | PRN
  Administered 2023-04-15: 500 mg via TOPICAL

## 2023-04-15 MED ORDER — SUGAMMADEX SODIUM 200 MG/2ML IV SOLN
INTRAVENOUS | Status: DC | PRN
  Administered 2023-04-15: 200 mg via INTRAVENOUS

## 2023-04-15 MED ORDER — OXYCODONE HCL 5 MG PO TABS: 5.0000 mg | ORAL_TABLET | ORAL | 0 refills | Status: AC | PRN

## 2023-04-15 MED ORDER — ONDANSETRON HCL 4 MG/2ML IJ SOLN
4.0000 mg | Freq: Once | INTRAMUSCULAR | Status: DC | PRN
Start: 2023-04-15 — End: 2023-04-15

## 2023-04-15 MED ORDER — ROCURONIUM BROMIDE 10 MG/ML (PF) SYRINGE
PREFILLED_SYRINGE | INTRAVENOUS | Status: DC | PRN
  Administered 2023-04-15: 30 mg via INTRAVENOUS

## 2023-04-15 MED ORDER — ACETAMINOPHEN 325 MG PO TABS: 325.0000 mg | ORAL_TABLET | ORAL | Status: DC | PRN

## 2023-04-15 MED ORDER — METHOCARBAMOL 500 MG PO TABS: 500.0000 mg | ORAL_TABLET | Freq: Three times a day (TID) | ORAL | 0 refills | Status: AC | PRN

## 2023-04-15 MED ORDER — ONDANSETRON HCL 4 MG PO TABS: 4.0000 mg | ORAL_TABLET | Freq: Three times a day (TID) | ORAL | 0 refills | Status: AC | PRN

## 2023-04-15 MED ORDER — CEFAZOLIN SODIUM-DEXTROSE 2-4 GM/100ML-% IV SOLN
INTRAVENOUS | Status: AC
  Filled 2023-04-15: qty 100

## 2023-04-15 MED ORDER — POLYETHYLENE GLYCOL 3350 17 G PO PACK
17.0000 g | PACK | Freq: Every day | ORAL | 0 refills | Status: AC
Start: 2023-04-15 — End: ?

## 2023-04-15 SURGICAL SUPPLY — 72 items
BANDAGE ESMARK 6X9 LF (GAUZE/BANDAGES/DRESSINGS) ×1 IMPLANT
BIT DRILL 2 CANN GRADUATED (BIT) IMPLANT
BIT DRILL 2.5 CANN LNG (BIT) IMPLANT
BIT DRILL 2.6 CANN (BIT) IMPLANT
BIT DRILL 2.7X2.7/3XSCR ANKL (BIT) IMPLANT
BIT DRL 2.7X2.7/3XSCR ANKL (BIT) ×1
BLADE SURG 15 STRL LF DISP TIS (BLADE) ×2 IMPLANT
BNDG COHESIVE 4X5 TAN STRL LF (GAUZE/BANDAGES/DRESSINGS) ×1 IMPLANT
BNDG ELASTIC 4INX 5YD STR LF (GAUZE/BANDAGES/DRESSINGS) ×1 IMPLANT
BNDG ELASTIC 6INX 5YD STR LF (GAUZE/BANDAGES/DRESSINGS) ×1 IMPLANT
BNDG ESMARK 6X9 LF (GAUZE/BANDAGES/DRESSINGS) ×1
CHLORAPREP W/TINT 26 (MISCELLANEOUS) ×1 IMPLANT
COVER BACK TABLE 60X90IN (DRAPES) ×1 IMPLANT
CUFF TRNQT CYL 24X4X16.5-23 (TOURNIQUET CUFF) IMPLANT
CUFF TRNQT CYL 34X4.125X (TOURNIQUET CUFF) IMPLANT
DRAPE C-ARM 42X72 X-RAY (DRAPES) ×1 IMPLANT
DRAPE C-ARMOR (DRAPES) ×1 IMPLANT
DRAPE EXTREMITY T 121X128X90 (DISPOSABLE) ×1 IMPLANT
DRAPE IMP U-DRAPE 54X76 (DRAPES) ×1 IMPLANT
DRAPE U-SHAPE 47X51 STRL (DRAPES) ×1 IMPLANT
DRSG ADAPTIC 3X8 NADH LF (GAUZE/BANDAGES/DRESSINGS) ×1 IMPLANT
ELECT REM PT RETURN 9FT ADLT (ELECTROSURGICAL) ×1
ELECTRODE REM PT RTRN 9FT ADLT (ELECTROSURGICAL) ×1 IMPLANT
GAUZE PAD ABD 8X10 STRL (GAUZE/BANDAGES/DRESSINGS) ×1 IMPLANT
GAUZE SPONGE 4X4 12PLY STRL (GAUZE/BANDAGES/DRESSINGS) ×1 IMPLANT
GLOVE BIO SURGEON STRL SZ 6.5 (GLOVE) ×1 IMPLANT
GLOVE BIOGEL PI IND STRL 6.5 (GLOVE) ×1 IMPLANT
GLOVE BIOGEL PI IND STRL 7.0 (GLOVE) IMPLANT
GLOVE BIOGEL PI IND STRL 8 (GLOVE) ×1 IMPLANT
GLOVE SURG ORTHO 8.0 STRL STRW (GLOVE) ×1 IMPLANT
GLOVE SURG SYN 8.0 (GLOVE) ×2
GLOVE SURG SYN 8.0 PF PI (GLOVE) IMPLANT
GOWN STRL REUS W/ TWL LRG LVL3 (GOWN DISPOSABLE) ×2 IMPLANT
GOWN STRL REUS W/ TWL XL LVL3 (GOWN DISPOSABLE) IMPLANT
GOWN STRL REUS W/TWL 2XL LVL3 (GOWN DISPOSABLE) IMPLANT
GOWN STRL REUS W/TWL XL LVL3 (GOWN DISPOSABLE) ×1 IMPLANT
GUIDEWIRE 1.35MM (WIRE) IMPLANT
IMPL TIGHTROP W/DRV K-LESS (Anchor) IMPLANT
IMPLANT TIGHTROPE W/DRV K-LESS (Anchor) ×1 IMPLANT
K-WIRE BB-TAK (WIRE) ×2
KWIRE BB-TAK (WIRE) IMPLANT
MAT PREVALON FULL STRYKER (MISCELLANEOUS) IMPLANT
NDL HYPO 22X1.5 SAFETY MO (MISCELLANEOUS) IMPLANT
NEEDLE HYPO 22X1.5 SAFETY MO (MISCELLANEOUS)
NS IRRIG 1000ML POUR BTL (IV SOLUTION) ×1 IMPLANT
PACK BASIN DAY SURGERY FS (CUSTOM PROCEDURE TRAY) ×1 IMPLANT
PAD CAST 4YDX4 CTTN HI CHSV (CAST SUPPLIES) ×1 IMPLANT
PADDING CAST ABS COTTON 4X4 ST (CAST SUPPLIES) ×2 IMPLANT
PADDING CAST COTTON 6X4 STRL (CAST SUPPLIES) ×1 IMPLANT
PENCIL SMOKE EVACUATOR (MISCELLANEOUS) ×1 IMPLANT
PLATE FIBULA 6H (Plate) IMPLANT
SCREW COMP KREULOCK 2.7X14 (Screw) IMPLANT
SCREW COMP KREULOCK 2.7X16 (Screw) IMPLANT
SCREW COMP KREULOCK 2.7X18 (Screw) IMPLANT
SCREW CORTICAL 2.7X18 LO PRO (Screw) IMPLANT
SCREW LOW PROFILE 3.5X14 (Screw) IMPLANT
SCREW LOW PROFILE 3.5X16 (Screw) IMPLANT
SCREW LOW PROFILE CANN 4.0X45 (Screw) IMPLANT
SLEEVE SCD COMPRESS KNEE MED (STOCKING) ×1 IMPLANT
SPIKE FLUID TRANSFER (MISCELLANEOUS) IMPLANT
SPLINT PLASTER CAST FAST 5X30 (CAST SUPPLIES) ×1 IMPLANT
SPONGE T-LAP 18X18 ~~LOC~~+RFID (SPONGE) ×1 IMPLANT
SUCTION TUBE FRAZIER 10FR DISP (SUCTIONS) ×1 IMPLANT
SUT ETHILON 3 0 PS 1 (SUTURE) ×1 IMPLANT
SUT VIC AB 0 CT1 27XBRD ANBCTR (SUTURE) ×1 IMPLANT
SUT VIC AB 2-0 SH 27XBRD (SUTURE) ×1 IMPLANT
SYR BULB EAR ULCER 3OZ GRN STR (SYRINGE) ×1 IMPLANT
SYR CONTROL 10ML LL (SYRINGE) IMPLANT
TOWEL GREEN STERILE FF (TOWEL DISPOSABLE) ×2 IMPLANT
TUBE CONNECTING 20X1/4 (TUBING) ×1 IMPLANT
UNDERPAD 30X36 HEAVY ABSORB (UNDERPADS AND DIAPERS) ×1 IMPLANT
YANKAUER SUCT BULB TIP NO VENT (SUCTIONS) ×1 IMPLANT

## 2023-04-15 NOTE — H&P (Signed)
ORTHOPAEDIC H&P  Chief Complaint: Left ankle fracture  HPI: Colin Mason is a 48 y.o. male who slipped and fell on ice on 04/06/23 resulting in pain deformity to left ankle.  He was seen in emergency room found to have left ankle fracture.  This was closed reduced and splinted.  Past Medical History:  Diagnosis Date   GERD (gastroesophageal reflux disease)    Past Surgical History:  Procedure Laterality Date   COLONOSCOPY     Social History   Socioeconomic History   Marital status: Single    Spouse name: Not on file   Number of children: Not on file   Years of education: Not on file   Highest education level: Not on file  Occupational History   Not on file  Tobacco Use   Smoking status: Every Day    Current packs/day: 0.25    Types: Cigarettes   Smokeless tobacco: Never  Vaping Use   Vaping status: Never Used  Substance and Sexual Activity   Alcohol use: No   Drug use: Yes    Types: Marijuana   Sexual activity: Not on file  Other Topics Concern   Not on file  Social History Narrative   Not on file   Social Drivers of Health   Financial Resource Strain: Not on file  Food Insecurity: Not on file  Transportation Needs: Not on file  Physical Activity: Not on file  Stress: Not on file  Social Connections: Unknown (08/01/2021)   Received [facility]   Social Network    Social Network: Not on file   History reviewed. No pertinent family history. Allergies  Allergen Reactions   Bee Venom Itching and Swelling    Facial and orbital swelling Itching of hands and feet   Wasp Venom Itching and Swelling    Facial and orbital swelling Itching of hands and feet     Positive ROS: All other systems have been reviewed and were otherwise negative with the exception of those mentioned in the HPI and as above.  Physical Exam: General: Alert, no acute distress Cardiovascular: No pedal edema Respiratory: No cyanosis, no use of accessory  musculature Skin: No lesions in the area of chief complaint Neurologic: Sensation intact distally Psychiatric: Patient is competent for consent with normal mood and affect  MUSCULOSKELETAL:  LLE No traumatic wounds, ecchymosis, or rash  Mild swelling about the left ankle, tenderness to palpation  No groin pain with log roll  No knee effusion  Sens DPN, SPN, TN intact  Motor EHL, ext, flex 5/5  DP 2+, PT 2+, No significant edema   IMAGING:  Left ankle x-rays demonstrate displaced left bimalleolar ankle fracture  Assessment: Left displaced bimalleolar ankle fracture  Plan: The risks benefits and alternatives were discussed with the patient including but not limited to the risks of nonoperative treatment, versus surgical intervention including infection, bleeding, nerve injury, malunion, nonunion, the need for revision surgery, hardware prominence, hardware failure, the need for hardware removal, blood clots, cardiopulmonary complications, morbidity, mortality, among others, and they were willing to proceed.    Joen Laura, MD  Contact information:   [id number] 7am-5pm epic message Dr. Blanchie Dessert, or call office for patient follow up: [phone] After hours and holidays please check [email or web address] for group call information for [facility]

## 2023-04-15 NOTE — Progress Notes (Signed)
Assisted Dr. Tacy Dura with left, adductor canal, popliteal, ultrasound guided block. Side rails up, monitors on throughout procedure. See vital signs in flow sheet. Tolerated Procedure well.

## 2023-04-15 NOTE — Anesthesia Procedure Notes (Signed)
Anesthesia Regional Block: Popliteal block   Pre-Anesthetic Checklist: , timeout performed,  Correct Patient, Correct Site, Correct Laterality,  Correct Procedure, Correct Position, site marked,  Risks and benefits discussed,  Surgical consent,  Pre-op evaluation,  At surgeon's request and post-op pain management  Laterality: Left  Prep: chloraprep       Needles:  Injection technique: Single-shot  Needle Type: Echogenic Stimulator Needle     Needle Length: 5cm  Needle Gauge: 22     Additional Needles:   Procedures:, nerve stimulator,,, ultrasound used (permanent image in chart),,     Nerve Stimulator or Paresthesia:  Response: foot, 0.45 mA  Additional Responses:   Narrative:  Start time: 04/15/2023 6:55 AM End time: 04/15/2023 7:00 AM Injection made incrementally with aspirations every 5 mL.  Performed by: Personally  Anesthesiologist: Bethena Midget, MD  Additional Notes: Functioning IV was confirmed and monitors were applied.  A 50mm 22ga Arrow echogenic stimulator needle was used. Sterile prep and drape,hand hygiene and sterile gloves were used. Ultrasound guidance: relevant anatomy identified, needle position confirmed, local anesthetic spread visualized around nerve(s)., vascular puncture avoided.  Image printed for medical record. Negative aspiration and negative test dose prior to incremental administration of local anesthetic. The patient tolerated the procedure well.

## 2023-04-15 NOTE — Op Note (Addendum)
04/15/2023  PATIENT:  Colin Mason    PRE-OPERATIVE DIAGNOSIS:  LEFT displaced trimalleolar ankle fracture  POST-OPERATIVE DIAGNOSIS:  Same  PROCEDURE:  OPEN REDUCTION INTERNAL FIXATION (ORIF) trimalleolar ANKLE FRACTURE, open reduction fixation syndesmosis, application of short leg splint  SURGEON:  Jenne Sellinger A Santrice Muzio, MD  PHYSICIAN ASSISTANT: Kathie Dike, PA-C, present and scrubbed throughout the case, critical for completion in a timely fashion, and for retraction, instrumentation, and closure.  ANESTHESIA:   General  ESTIMATED BLOOD LOSS: 50cc  PREOPERATIVE INDICATIONS:  Colin Mason is a  48 y.o. male with a diagnosis of LEFT ANKLE FRACTURE who elected for surgical management to minimize the risk for malunion and nonunion and post-traumatic arthritis.    The risks benefits and alternatives were discussed with the patient preoperatively including but not limited to the risks of infection, bleeding, nerve injury, cardiopulmonary complications, the need for revision surgery, the need for hardware removal, among others, and the patient was willing to proceed.  OPERATIVE IMPLANTS:  Implant Name Type Inv. Item Serial No. Manufacturer Lot No. LRB No. Used Action  SCREW CORTICAL 2.7X18 LO PRO - [id number] Screw SCREW CORTICAL 2.7X18 LO PRO  [employer] STERILE ON SET Left 1 Implanted  PLATE FIBULA 6H - [id number] Plate PLATE FIBULA 6H  [employer] STERILE ON SET Left 1 Implanted  SCREW LOW PROFILE 3.5X16 - [id number] Screw SCREW LOW PROFILE 3.5X16  [employer] STERILE ON SET Left 2 Implanted  SCREW LOW PROFILE 3.5X14 - [id number] Screw SCREW LOW PROFILE 3.5X14  [employer] STERILE ON SET Left 1 Implanted  SCREW COMP KREULOCK 2.7X18 - [id number] Screw SCREW COMP KREULOCK 2.7X18  [employer] STERILE ON SET Left 1 Implanted  SCREW COMP KREULOCK 2.7X16 - [id number] Screw SCREW COMP KREULOCK 2.7X16  [employer] STERILE ON SET Left 2 Implanted  SCREW COMP KREULOCK 2.7X14 - [id number]  Screw SCREW COMP KREULOCK 2.7X14  [employer] STERILE ON SET Left 1 Implanted  SCREW LOW PROFILE CANN 4.0X45 - [id number] Screw SCREW LOW PROFILE CANN 4.0X45  [employer] STERILE ON SET Left 1 Implanted  IMPLANT TIGHTROPE W/DRV K-LESS - [id number] Anchor IMPLANT TIGHTROPE W/DRV [name] [id number] Left 1 Implanted    OPERATIVE PROCEDURE: The patient was brought to the operating room and placed in the supine position. All bony prominences were padded. Non sterile Tourniquet was placed but not inflated. General anesthesia was administered. The lower extremity was prepped and draped in the usual sterile fashion.  Time out was performed.   Incision was made over the distal fibula and the fracture was exposed and reduced anatomically with a clamp. A lag screw was placed. Dissection for the plate proximally was performed with metz to bluntly spread and to protect crossing branches of the superficial peroneal nerve. I then applied an appropriately sized distal fibular locking plate and secured it proximally with non locking screws and distally with locking screws. Bone quality was good. I used c-arm to confirm satisfactory reduction and fixation.   I then turned my attention to the medial malleolus. Incision was made over the medial malleolus and the fracture exposed and held provisionally with a clamp.  A guidepin was placed for the 4.0 mm cannulated screw and then confirmation of reduction was made with fluoroscopy. I then placed a 45mm screw which had satisfactory fixation.   The syndesmosis was stressed using live fluoroscopy and found to widen.  Elected for syndesmotic fixation with a tight rope.  A K wire was placed quad cortical aiming 30  degrees anterior through the fibula and tibia.  This was overdrilled and then the tight rope device was passed.  The sutures were released and the tightrope was tensioned.  This had adequate reduction of the syndesmosis and medial clear space.    The wounds  were irrigated, vancomycin powder was placed into the wound, and closed with 0 Vicryl, 2-0 vicryl.  3-0 nylon closure for the skin. Adaptic and Sterile gauze was applied followed by a posterior splint. He was awakened and returned to the PACU in stable and satisfactory condition. There were no complications.  Post op recs: WB: NWB LLE in splint Dressing: keep splint intact until follow up DVT prophylaxis: aspirin 81mg  BID x4 weeks Follow up: 2 weeks after surgery for a wound check and suture removal with Dr. Blanchie Dessert at [facility].  Address: [address], [place], [place] [address]  Office Phone: [phone]  Weber Cooks, MD Orthopaedic Surgery

## 2023-04-15 NOTE — Anesthesia Procedure Notes (Signed)
Procedure Name: LMA Insertion Date/Time: 04/15/2023 7:36 AM  Performed by: Yolanda Bonine, CRNAPre-anesthesia Checklist: Patient identified, Emergency Drugs available, Suction available, Patient being monitored and Timeout performed Patient Re-evaluated:Patient Re-evaluated prior to induction Oxygen Delivery Method: Circle system utilized Preoxygenation: Pre-oxygenation with 100% oxygen Induction Type: IV induction Ventilation: Mask ventilation without difficulty LMA: LMA inserted LMA Size: 5.0 Dental Injury: Teeth and Oropharynx as per pre-operative assessment

## 2023-04-15 NOTE — Anesthesia Procedure Notes (Signed)
Anesthesia Regional Block: Adductor canal block   Pre-Anesthetic Checklist: , timeout performed,  Correct Patient, Correct Site, Correct Laterality,  Correct Procedure, Correct Position, site marked,  Risks and benefits discussed,  Surgical consent,  Pre-op evaluation,  At surgeon's request and post-op pain management  Laterality: Left  Prep: chloraprep       Needles:  Injection technique: Single-shot  Needle Type: Echogenic Stimulator Needle     Needle Length: 5cm  Needle Gauge: 22     Additional Needles:   Procedures:, nerve stimulator,,, ultrasound used (permanent image in chart),,    Narrative:  Start time: 04/15/2023 7:00 AM End time: 04/15/2023 7:05 AM Injection made incrementally with aspirations every 5 mL.  Performed by: Personally  Anesthesiologist: Bethena Midget, MD  Additional Notes: Functioning IV was confirmed and monitors were applied.  A 50mm 22ga Arrow echogenic stimulator needle was used. Sterile prep and drape,hand hygiene and sterile gloves were used. Ultrasound guidance: relevant anatomy identified, needle position confirmed, local anesthetic spread visualized around nerve(s)., vascular puncture avoided.  Image printed for medical record. Negative aspiration and negative test dose prior to incremental administration of local anesthetic. The patient tolerated the procedure well.

## 2023-04-15 NOTE — Discharge Instructions (Addendum)
----No Tylenol or ibuprofen until after 12:50pm today.     Orthopedic Discharge Instructions  Diet: As you were doing prior to hospitalization   Shower:  May shower but keep the wounds dry, use an occlusive plastic wrap, NO SOAKING IN TUB.  If the bandage gets wet, change with a clean dry gauze.  If you have a splint on, leave the splint in place and keep the splint dry with a plastic bag.  Dressing:  You may change your dressing 3-5 days after surgery, unless you have a splint.  If the dressing remains clean and dry it can also be left on until follow up.  If you change the dressing replace with clean gauze and tape or ace wrap.  If you have a splint, then just leave the splint in place and we will change your bandages during your first follow-up appointment.  If water gets in the splint or the splint gets saturated please call the clinic and we can see you to change your splint.  For most surgeries, the stitches used are dissolvable and don't need to be removed.  However, depending on your surgery, you may have stitches that will need to be removed in the office in about 2 weeks.    Activity:  Increase activity slowly as tolerated, but follow the weight bearing instructions below.  Do not drive for the next 6 weeks.  In addition, you cannot be taking narcotics while you drive, and you must feel in control of the vehicle.    Weight Bearing:   Do not bear weight on your surgical leg for 6 weeks from surgery.    Blood clot prevention (DVT Prophylaxis): After surgery you are at an increased risk for a blood clot. you were prescribed a blood thinner, Aspirin 81 mg, to be taken twice daily for a total of 4 weeks from surgery to help reduce your risk of getting a blood clot. Signs of a pulmonary embolus (blood clot in the lungs) include sudden short of breath, feeling lightheaded or dizzy, chest pain with a deep breath, rapid pulse rapid breathing.  Signs of a blood clot in your arms or legs include  new unexplained swelling and cramping, warm, red or darkened skin around the painful area.  Please call the office or 911 right away if these signs or symptoms develop.  To prevent constipation: you may use a stool softener such as - Colace (over the counter) 100 mg by mouth twice a day  Drink plenty of fluids (prune juice may be helpful) and high fiber foods Miralax (over the counter) for constipation as needed.    Itching:  If you experience itching with your medications, try taking only a single pain pill, or even half a pain pill at a time.  You may take up to 10 pain pills per day, and you can also use benadryl over the counter for itching or also to help with sleep.   Precautions:  If you experience chest pain or shortness of breath - call 911 immediately for transfer to the hospital emergency department!!  Call office [phone]) for the following: Temperature greater than 101F Persistent nausea and vomiting Severe uncontrolled pain Redness, tenderness, or signs of infection (pain, swelling, redness, odor or green/yellow discharge around the site) Difficulty breathing, headache or visual disturbances Hives Persistent dizziness or light-headedness Extreme fatigue Any other questions or concerns you may have after discharge  In an emergency, call 911 or go to an Emergency Department at a nearby  hospital  Follow- Up Appointment:  Please call for an appointment to be seen approximately 2-3 week after surgery in [facility] with your surgeon Dr. Weber Cooks - [phone] Address: [address], [place], [place] [address]    Information for Discharge Teaching: EXPAREL (bupivacaine liposome injectable suspension)   Pain relief is important to your recovery. The goal is to control your pain so you can move easier and return to your normal activities as soon as possible after your procedure. Your physician may use several types of medicines to manage pain, swelling,  and more.  Your surgeon or anesthesiologist gave you EXPAREL(bupivacaine) to help control your pain after surgery.  EXPAREL is a local anesthetic designed to release slowly over an extended period of time to provide pain relief by numbing the tissue around the surgical site. EXPAREL is designed to release pain medication over time and can control pain for up to 72 hours. Depending on how you respond to EXPAREL, you may require less pain medication during your recovery. EXPAREL can help reduce or eliminate the need for opioids during the first few days after surgery when pain relief is needed the most. EXPAREL is not an opioid and is not addictive. It does not cause sleepiness or sedation.   Important! A teal colored band has been placed on your arm with the date, time and amount of EXPAREL you have received. Please leave this armband in place for the full 96 hours following administration, and then you may remove the band. If you return to the hospital for any reason within 96 hours following the administration of EXPAREL, the armband provides important information that your health care providers to know, and alerts them that you have received this anesthetic.    Possible side effects of EXPAREL: Temporary loss of sensation or ability to move in the area where medication was injected. Nausea, vomiting, constipation Rarely, numbness and tingling in your mouth or lips, lightheadedness, or anxiety may occur. Call your doctor right away if you think you may be experiencing any of these sensations, or if you have other questions regarding possible side effects.  Follow all other discharge instructions given to you by your surgeon or nurse. Eat a healthy diet and drink plenty of water or other fluids.  Regional Anesthesia Blocks  1. You may not be able to move or feel the "blocked" extremity after a regional anesthetic block. This may last may last from 3-48 hours after placement, but it will go away.  The length of time depends on the medication injected and your individual response to the medication. As the nerves start to wake up, you may experience tingling as the movement and feeling returns to your extremity. If the numbness and inability to move your extremity has not gone away after 48 hours, please call your surgeon.   2. The extremity that is blocked will need to be protected until the numbness is gone and the strength has returned. Because you cannot feel it, you will need to take extra care to avoid injury. Because it may be weak, you may have difficulty moving it or using it. You may not know what position it is in without looking at it while the block is in effect.  3. For blocks in the legs and feet, returning to weight bearing and walking needs to be done carefully. You will need to wait until the numbness is entirely gone and the strength has returned. You should be able to move your  leg and foot normally before you try and bear weight or walk. You will need someone to be with you when you first try to ensure you do not fall and possibly risk injury.  4. Bruising and tenderness at the needle site are common side effects and will resolve in a few days.  5. Persistent numbness or new problems with movement should be communicated to the surgeon or the [facility] [phone] [facility] [phone]).   Post Anesthesia Home Care Instructions  Activity: Get plenty of rest for the remainder of the day. A responsible individual must stay with you for 24 hours following the procedure.  For the next 24 hours, DO NOT: -Drive a car -Advertising copywriter -Drink alcoholic beverages -Take any medication unless instructed by your physician -Make any legal decisions or sign important papers.  Meals: Start with liquid foods such as gelatin or soup. Progress to regular foods as tolerated. Avoid greasy, spicy, heavy foods. If nausea and/or vomiting occur, drink only  clear liquids until the nausea and/or vomiting subsides. Call your physician if vomiting continues.  Special Instructions/Symptoms: Your throat may feel dry or sore from the anesthesia or the breathing tube placed in your throat during surgery. If this causes discomfort, gargle with warm salt water. The discomfort should disappear within 24 hours.  If you had a scopolamine patch placed behind your ear for the management of post- operative nausea and/or vomiting:  1. The medication in the patch is effective for 72 hours, after which it should be removed.  Wrap patch in a tissue and discard in the trash. Wash hands thoroughly with soap and water. 2. You may remove the patch earlier than 72 hours if you experience unpleasant side effects which may include dry mouth, dizziness or visual disturbances. 3. Avoid touching the patch. Wash your hands with soap and water after contact with the patch.

## 2023-04-16 ENCOUNTER — Encounter (HOSPITAL_BASED_OUTPATIENT_CLINIC_OR_DEPARTMENT_OTHER): Payer: Self-pay | Admitting: Orthopedic Surgery

## 2023-04-16 NOTE — Anesthesia Postprocedure Evaluation (Signed)
Anesthesia Post Note  Patient: [employer]  Procedure(s) Performed: OPEN REDUCTION INTERNAL FIXATION (ORIF) ANKLE FRACTURE (Left: Ankle)     Patient location during evaluation: PACU Anesthesia Type: Regional and General Level of consciousness: awake and alert Pain management: pain level controlled Vital Signs Assessment: post-procedure vital signs reviewed and stable Respiratory status: spontaneous breathing, nonlabored ventilation, respiratory function stable and patient connected to nasal cannula oxygen Cardiovascular status: blood pressure returned to baseline and stable Postop Assessment: no apparent nausea or vomiting Anesthetic complications: no   No notable events documented.  Last Vitals:  Vitals:   04/15/23 1000 04/15/23 1020  BP: (!) 141/93 (!) 160/98  Pulse: 93 95  Resp: (!) 8 16  Temp:  (!) 36.4 C  SpO2: 96% 96%    Last Pain:  Vitals:   04/15/23 1020  TempSrc:   PainSc: 0-No pain                 Shenandoah Vandergriff

## 2023-04-16 NOTE — Transfer of Care (Signed)
Immediate Anesthesia Transfer of Care Note  Patient: [employer]  Procedure(s) Performed: OPEN REDUCTION INTERNAL FIXATION (ORIF) ANKLE FRACTURE (Left: Ankle)  Patient Location: PACU  Anesthesia Type:General  Level of Consciousness: drowsy and patient cooperative  Airway & Oxygen Therapy: Patient Spontanous Breathing and Patient connected to face mask oxygen  Post-op Assessment: Report given to RN and Post -op Vital signs reviewed and stable  Post vital signs: Reviewed and stable  Last Vitals:  Vitals Value Taken Time  BP 160/98 04/15/23 1020  Temp 36.4 C 04/15/23 1020  Pulse 95 04/15/23 1020  Resp 16 04/15/23 1020  SpO2 96 % 04/15/23 1020    Last Pain:  Vitals:   04/15/23 1020  TempSrc:   PainSc: 0-No pain      Patients Stated Pain Goal: 7 (04/15/23 0646)  Complications: No notable events documented.
